# Patient Record
Sex: Female | Born: 1940 | Race: White | Hispanic: No | Marital: Married | Smoking: Never smoker
Health system: Southern US, Community
[De-identification: ages and names within clinical notes are randomized; demographics above are authoritative.]

## PROBLEM LIST (undated history)

## (undated) DIAGNOSIS — J45909 Unspecified asthma, uncomplicated: Secondary | ICD-10-CM

## (undated) HISTORY — PX: HIP ARTHROPLASTY: SHX981

---

## 1999-02-03 ENCOUNTER — Other Ambulatory Visit: Admission: RE | Admit: 1999-02-03 | Discharge: 1999-02-03 | Payer: Self-pay | Admitting: *Deleted

## 2007-01-18 ENCOUNTER — Encounter: Admission: RE | Admit: 2007-01-18 | Discharge: 2007-01-18 | Payer: Self-pay | Admitting: Orthopedic Surgery

## 2011-11-02 DIAGNOSIS — B0239 Other herpes zoster eye disease: Secondary | ICD-10-CM | POA: Diagnosis not present

## 2011-11-04 DIAGNOSIS — F4322 Adjustment disorder with anxiety: Secondary | ICD-10-CM | POA: Diagnosis not present

## 2011-11-04 DIAGNOSIS — B028 Zoster with other complications: Secondary | ICD-10-CM | POA: Diagnosis not present

## 2011-11-09 DIAGNOSIS — B0239 Other herpes zoster eye disease: Secondary | ICD-10-CM | POA: Diagnosis not present

## 2011-11-11 DIAGNOSIS — F4322 Adjustment disorder with anxiety: Secondary | ICD-10-CM | POA: Diagnosis not present

## 2011-11-11 DIAGNOSIS — B0222 Postherpetic trigeminal neuralgia: Secondary | ICD-10-CM | POA: Diagnosis not present

## 2011-11-17 DIAGNOSIS — B0222 Postherpetic trigeminal neuralgia: Secondary | ICD-10-CM | POA: Diagnosis not present

## 2011-11-18 DIAGNOSIS — B0239 Other herpes zoster eye disease: Secondary | ICD-10-CM | POA: Diagnosis not present

## 2011-11-23 DIAGNOSIS — Z86010 Personal history of colonic polyps: Secondary | ICD-10-CM | POA: Diagnosis not present

## 2011-11-24 DIAGNOSIS — B0222 Postherpetic trigeminal neuralgia: Secondary | ICD-10-CM | POA: Diagnosis not present

## 2011-12-01 DIAGNOSIS — B0222 Postherpetic trigeminal neuralgia: Secondary | ICD-10-CM | POA: Diagnosis not present

## 2011-12-15 DIAGNOSIS — IMO0001 Reserved for inherently not codable concepts without codable children: Secondary | ICD-10-CM | POA: Diagnosis not present

## 2011-12-15 DIAGNOSIS — B0222 Postherpetic trigeminal neuralgia: Secondary | ICD-10-CM | POA: Diagnosis not present

## 2011-12-15 DIAGNOSIS — R5383 Other fatigue: Secondary | ICD-10-CM | POA: Diagnosis not present

## 2011-12-19 DIAGNOSIS — B0239 Other herpes zoster eye disease: Secondary | ICD-10-CM | POA: Diagnosis not present

## 2011-12-19 DIAGNOSIS — H40019 Open angle with borderline findings, low risk, unspecified eye: Secondary | ICD-10-CM | POA: Diagnosis not present

## 2011-12-28 DIAGNOSIS — IMO0001 Reserved for inherently not codable concepts without codable children: Secondary | ICD-10-CM | POA: Diagnosis not present

## 2011-12-28 DIAGNOSIS — B0222 Postherpetic trigeminal neuralgia: Secondary | ICD-10-CM | POA: Diagnosis not present

## 2011-12-28 DIAGNOSIS — F341 Dysthymic disorder: Secondary | ICD-10-CM | POA: Diagnosis not present

## 2011-12-28 DIAGNOSIS — K432 Incisional hernia without obstruction or gangrene: Secondary | ICD-10-CM | POA: Diagnosis not present

## 2011-12-28 DIAGNOSIS — M1710 Unilateral primary osteoarthritis, unspecified knee: Secondary | ICD-10-CM | POA: Diagnosis not present

## 2012-01-11 DIAGNOSIS — B0222 Postherpetic trigeminal neuralgia: Secondary | ICD-10-CM | POA: Diagnosis not present

## 2012-01-11 DIAGNOSIS — M159 Polyosteoarthritis, unspecified: Secondary | ICD-10-CM | POA: Diagnosis not present

## 2012-01-11 DIAGNOSIS — F341 Dysthymic disorder: Secondary | ICD-10-CM | POA: Diagnosis not present

## 2012-02-09 DIAGNOSIS — R609 Edema, unspecified: Secondary | ICD-10-CM | POA: Diagnosis not present

## 2012-02-09 DIAGNOSIS — F341 Dysthymic disorder: Secondary | ICD-10-CM | POA: Diagnosis not present

## 2012-02-09 DIAGNOSIS — M159 Polyosteoarthritis, unspecified: Secondary | ICD-10-CM | POA: Diagnosis not present

## 2012-02-09 DIAGNOSIS — B0222 Postherpetic trigeminal neuralgia: Secondary | ICD-10-CM | POA: Diagnosis not present

## 2012-03-19 DIAGNOSIS — B0222 Postherpetic trigeminal neuralgia: Secondary | ICD-10-CM | POA: Diagnosis not present

## 2012-04-20 DIAGNOSIS — IMO0001 Reserved for inherently not codable concepts without codable children: Secondary | ICD-10-CM | POA: Diagnosis not present

## 2012-04-20 DIAGNOSIS — M79609 Pain in unspecified limb: Secondary | ICD-10-CM | POA: Diagnosis not present

## 2012-04-20 DIAGNOSIS — B0222 Postherpetic trigeminal neuralgia: Secondary | ICD-10-CM | POA: Diagnosis not present

## 2012-05-02 DIAGNOSIS — H40019 Open angle with borderline findings, low risk, unspecified eye: Secondary | ICD-10-CM | POA: Diagnosis not present

## 2012-05-08 DIAGNOSIS — M25559 Pain in unspecified hip: Secondary | ICD-10-CM | POA: Diagnosis not present

## 2012-05-08 DIAGNOSIS — M1610 Unilateral primary osteoarthritis, unspecified hip: Secondary | ICD-10-CM | POA: Diagnosis not present

## 2012-05-22 DIAGNOSIS — M19079 Primary osteoarthritis, unspecified ankle and foot: Secondary | ICD-10-CM | POA: Diagnosis not present

## 2012-06-04 DIAGNOSIS — B0222 Postherpetic trigeminal neuralgia: Secondary | ICD-10-CM | POA: Diagnosis not present

## 2012-06-04 DIAGNOSIS — M79609 Pain in unspecified limb: Secondary | ICD-10-CM | POA: Diagnosis not present

## 2012-06-04 DIAGNOSIS — IMO0001 Reserved for inherently not codable concepts without codable children: Secondary | ICD-10-CM | POA: Diagnosis not present

## 2012-06-04 DIAGNOSIS — F411 Generalized anxiety disorder: Secondary | ICD-10-CM | POA: Diagnosis not present

## 2012-06-15 DIAGNOSIS — Z1231 Encounter for screening mammogram for malignant neoplasm of breast: Secondary | ICD-10-CM | POA: Diagnosis not present

## 2012-06-21 DIAGNOSIS — M76829 Posterior tibial tendinitis, unspecified leg: Secondary | ICD-10-CM | POA: Diagnosis not present

## 2012-06-21 DIAGNOSIS — M7730 Calcaneal spur, unspecified foot: Secondary | ICD-10-CM | POA: Diagnosis not present

## 2012-06-21 DIAGNOSIS — M2140 Flat foot [pes planus] (acquired), unspecified foot: Secondary | ICD-10-CM | POA: Diagnosis not present

## 2012-07-13 DIAGNOSIS — M2140 Flat foot [pes planus] (acquired), unspecified foot: Secondary | ICD-10-CM | POA: Diagnosis not present

## 2012-07-13 DIAGNOSIS — M7730 Calcaneal spur, unspecified foot: Secondary | ICD-10-CM | POA: Diagnosis not present

## 2012-07-13 DIAGNOSIS — M76829 Posterior tibial tendinitis, unspecified leg: Secondary | ICD-10-CM | POA: Diagnosis not present

## 2012-07-17 DIAGNOSIS — B0222 Postherpetic trigeminal neuralgia: Secondary | ICD-10-CM | POA: Diagnosis not present

## 2012-07-17 DIAGNOSIS — F411 Generalized anxiety disorder: Secondary | ICD-10-CM | POA: Diagnosis not present

## 2012-07-17 DIAGNOSIS — M79609 Pain in unspecified limb: Secondary | ICD-10-CM | POA: Diagnosis not present

## 2012-07-17 DIAGNOSIS — Z23 Encounter for immunization: Secondary | ICD-10-CM | POA: Diagnosis not present

## 2012-09-21 DIAGNOSIS — B0239 Other herpes zoster eye disease: Secondary | ICD-10-CM | POA: Diagnosis not present

## 2012-11-12 DIAGNOSIS — H40019 Open angle with borderline findings, low risk, unspecified eye: Secondary | ICD-10-CM | POA: Diagnosis not present

## 2012-12-19 DIAGNOSIS — M47817 Spondylosis without myelopathy or radiculopathy, lumbosacral region: Secondary | ICD-10-CM | POA: Diagnosis not present

## 2012-12-19 DIAGNOSIS — Z96649 Presence of unspecified artificial hip joint: Secondary | ICD-10-CM | POA: Diagnosis not present

## 2012-12-19 DIAGNOSIS — Z09 Encounter for follow-up examination after completed treatment for conditions other than malignant neoplasm: Secondary | ICD-10-CM | POA: Diagnosis not present

## 2012-12-19 DIAGNOSIS — M25559 Pain in unspecified hip: Secondary | ICD-10-CM | POA: Diagnosis not present

## 2013-01-11 DIAGNOSIS — R079 Chest pain, unspecified: Secondary | ICD-10-CM | POA: Diagnosis not present

## 2013-01-11 DIAGNOSIS — R0602 Shortness of breath: Secondary | ICD-10-CM | POA: Diagnosis not present

## 2013-01-11 DIAGNOSIS — R0609 Other forms of dyspnea: Secondary | ICD-10-CM | POA: Diagnosis not present

## 2013-01-11 DIAGNOSIS — F411 Generalized anxiety disorder: Secondary | ICD-10-CM | POA: Diagnosis not present

## 2013-01-11 DIAGNOSIS — R0989 Other specified symptoms and signs involving the circulatory and respiratory systems: Secondary | ICD-10-CM | POA: Diagnosis not present

## 2013-01-29 DIAGNOSIS — R079 Chest pain, unspecified: Secondary | ICD-10-CM | POA: Diagnosis not present

## 2013-01-29 DIAGNOSIS — F411 Generalized anxiety disorder: Secondary | ICD-10-CM | POA: Diagnosis not present

## 2013-01-29 DIAGNOSIS — M779 Enthesopathy, unspecified: Secondary | ICD-10-CM | POA: Diagnosis not present

## 2013-02-25 DIAGNOSIS — M6789 Other specified disorders of synovium and tendon, multiple sites: Secondary | ICD-10-CM | POA: Diagnosis not present

## 2013-02-25 DIAGNOSIS — M6240 Contracture of muscle, unspecified site: Secondary | ICD-10-CM | POA: Diagnosis not present

## 2013-03-18 DIAGNOSIS — M6789 Other specified disorders of synovium and tendon, multiple sites: Secondary | ICD-10-CM | POA: Diagnosis not present

## 2013-03-20 DIAGNOSIS — M6789 Other specified disorders of synovium and tendon, multiple sites: Secondary | ICD-10-CM | POA: Diagnosis not present

## 2013-03-22 DIAGNOSIS — M6789 Other specified disorders of synovium and tendon, multiple sites: Secondary | ICD-10-CM | POA: Diagnosis not present

## 2013-03-26 DIAGNOSIS — M6789 Other specified disorders of synovium and tendon, multiple sites: Secondary | ICD-10-CM | POA: Diagnosis not present

## 2013-03-28 DIAGNOSIS — M6789 Other specified disorders of synovium and tendon, multiple sites: Secondary | ICD-10-CM | POA: Diagnosis not present

## 2013-04-01 DIAGNOSIS — M6789 Other specified disorders of synovium and tendon, multiple sites: Secondary | ICD-10-CM | POA: Diagnosis not present

## 2013-05-15 DIAGNOSIS — H40019 Open angle with borderline findings, low risk, unspecified eye: Secondary | ICD-10-CM | POA: Diagnosis not present

## 2013-06-27 DIAGNOSIS — Z1231 Encounter for screening mammogram for malignant neoplasm of breast: Secondary | ICD-10-CM | POA: Diagnosis not present

## 2013-10-02 DIAGNOSIS — R079 Chest pain, unspecified: Secondary | ICD-10-CM | POA: Diagnosis not present

## 2013-10-02 DIAGNOSIS — R109 Unspecified abdominal pain: Secondary | ICD-10-CM | POA: Diagnosis not present

## 2013-10-02 DIAGNOSIS — R5381 Other malaise: Secondary | ICD-10-CM | POA: Diagnosis not present

## 2013-10-02 DIAGNOSIS — N63 Unspecified lump in unspecified breast: Secondary | ICD-10-CM | POA: Diagnosis not present

## 2013-10-02 DIAGNOSIS — R072 Precordial pain: Secondary | ICD-10-CM | POA: Diagnosis not present

## 2013-10-02 DIAGNOSIS — R1032 Left lower quadrant pain: Secondary | ICD-10-CM | POA: Diagnosis not present

## 2013-10-08 DIAGNOSIS — D492 Neoplasm of unspecified behavior of bone, soft tissue, and skin: Secondary | ICD-10-CM | POA: Diagnosis not present

## 2013-10-08 DIAGNOSIS — K439 Ventral hernia without obstruction or gangrene: Secondary | ICD-10-CM | POA: Diagnosis not present

## 2013-10-08 DIAGNOSIS — E279 Disorder of adrenal gland, unspecified: Secondary | ICD-10-CM | POA: Diagnosis not present

## 2013-10-08 DIAGNOSIS — K432 Incisional hernia without obstruction or gangrene: Secondary | ICD-10-CM | POA: Diagnosis not present

## 2013-10-08 DIAGNOSIS — R198 Other specified symptoms and signs involving the digestive system and abdomen: Secondary | ICD-10-CM | POA: Diagnosis not present

## 2013-10-08 DIAGNOSIS — R109 Unspecified abdominal pain: Secondary | ICD-10-CM | POA: Diagnosis not present

## 2013-10-08 DIAGNOSIS — E669 Obesity, unspecified: Secondary | ICD-10-CM | POA: Diagnosis not present

## 2013-10-15 DIAGNOSIS — D492 Neoplasm of unspecified behavior of bone, soft tissue, and skin: Secondary | ICD-10-CM | POA: Diagnosis not present

## 2013-10-15 DIAGNOSIS — L821 Other seborrheic keratosis: Secondary | ICD-10-CM | POA: Diagnosis not present

## 2013-11-05 DIAGNOSIS — D492 Neoplasm of unspecified behavior of bone, soft tissue, and skin: Secondary | ICD-10-CM | POA: Diagnosis not present

## 2013-11-05 DIAGNOSIS — K432 Incisional hernia without obstruction or gangrene: Secondary | ICD-10-CM | POA: Diagnosis not present

## 2013-11-05 DIAGNOSIS — E669 Obesity, unspecified: Secondary | ICD-10-CM | POA: Diagnosis not present

## 2013-12-02 DIAGNOSIS — H40019 Open angle with borderline findings, low risk, unspecified eye: Secondary | ICD-10-CM | POA: Diagnosis not present

## 2014-05-15 DIAGNOSIS — D485 Neoplasm of uncertain behavior of skin: Secondary | ICD-10-CM | POA: Diagnosis not present

## 2014-05-29 DIAGNOSIS — D1739 Benign lipomatous neoplasm of skin and subcutaneous tissue of other sites: Secondary | ICD-10-CM | POA: Diagnosis not present

## 2014-06-09 DIAGNOSIS — Z1231 Encounter for screening mammogram for malignant neoplasm of breast: Secondary | ICD-10-CM | POA: Diagnosis not present

## 2014-07-08 DIAGNOSIS — H40019 Open angle with borderline findings, low risk, unspecified eye: Secondary | ICD-10-CM | POA: Diagnosis not present

## 2014-07-17 DIAGNOSIS — R5383 Other fatigue: Secondary | ICD-10-CM | POA: Diagnosis not present

## 2014-07-17 DIAGNOSIS — Z79899 Other long term (current) drug therapy: Secondary | ICD-10-CM | POA: Diagnosis not present

## 2014-07-17 DIAGNOSIS — F411 Generalized anxiety disorder: Secondary | ICD-10-CM | POA: Diagnosis not present

## 2014-07-17 DIAGNOSIS — F341 Dysthymic disorder: Secondary | ICD-10-CM | POA: Diagnosis not present

## 2014-07-17 DIAGNOSIS — K2900 Acute gastritis without bleeding: Secondary | ICD-10-CM | POA: Diagnosis not present

## 2014-07-17 DIAGNOSIS — M25559 Pain in unspecified hip: Secondary | ICD-10-CM | POA: Diagnosis not present

## 2014-07-17 DIAGNOSIS — R1084 Generalized abdominal pain: Secondary | ICD-10-CM | POA: Diagnosis not present

## 2014-07-17 DIAGNOSIS — R5381 Other malaise: Secondary | ICD-10-CM | POA: Diagnosis not present

## 2014-07-23 ENCOUNTER — Telehealth (INDEPENDENT_AMBULATORY_CARE_PROVIDER_SITE_OTHER): Payer: Self-pay

## 2014-07-23 NOTE — Telephone Encounter (Signed)
Called and left 2 messages for patient to return a call to our office.  Will await call from patient

## 2014-08-26 DIAGNOSIS — Z96641 Presence of right artificial hip joint: Secondary | ICD-10-CM | POA: Diagnosis not present

## 2014-08-26 DIAGNOSIS — Z471 Aftercare following joint replacement surgery: Secondary | ICD-10-CM | POA: Diagnosis not present

## 2014-08-28 ENCOUNTER — Other Ambulatory Visit (INDEPENDENT_AMBULATORY_CARE_PROVIDER_SITE_OTHER): Payer: Self-pay

## 2014-08-28 DIAGNOSIS — G8929 Other chronic pain: Secondary | ICD-10-CM

## 2014-08-28 DIAGNOSIS — R1031 Right lower quadrant pain: Principal | ICD-10-CM

## 2014-08-28 DIAGNOSIS — K432 Incisional hernia without obstruction or gangrene: Secondary | ICD-10-CM | POA: Diagnosis not present

## 2014-09-02 ENCOUNTER — Inpatient Hospital Stay: Admission: RE | Admit: 2014-09-02 | Payer: Self-pay | Source: Ambulatory Visit

## 2014-09-08 DIAGNOSIS — R1084 Generalized abdominal pain: Secondary | ICD-10-CM | POA: Diagnosis not present

## 2014-09-08 DIAGNOSIS — M25551 Pain in right hip: Secondary | ICD-10-CM | POA: Diagnosis not present

## 2014-09-08 DIAGNOSIS — R0789 Other chest pain: Secondary | ICD-10-CM | POA: Diagnosis not present

## 2014-09-08 DIAGNOSIS — Z23 Encounter for immunization: Secondary | ICD-10-CM | POA: Diagnosis not present

## 2014-09-08 DIAGNOSIS — F411 Generalized anxiety disorder: Secondary | ICD-10-CM | POA: Diagnosis not present

## 2014-09-09 DIAGNOSIS — M7071 Other bursitis of hip, right hip: Secondary | ICD-10-CM | POA: Diagnosis not present

## 2014-09-09 DIAGNOSIS — M25551 Pain in right hip: Secondary | ICD-10-CM | POA: Diagnosis not present

## 2014-09-15 DIAGNOSIS — R002 Palpitations: Secondary | ICD-10-CM | POA: Diagnosis not present

## 2014-09-15 DIAGNOSIS — R9431 Abnormal electrocardiogram [ECG] [EKG]: Secondary | ICD-10-CM | POA: Diagnosis not present

## 2014-10-01 DIAGNOSIS — R9431 Abnormal electrocardiogram [ECG] [EKG]: Secondary | ICD-10-CM | POA: Diagnosis not present

## 2014-11-07 DIAGNOSIS — R5383 Other fatigue: Secondary | ICD-10-CM | POA: Diagnosis not present

## 2015-11-05 DIAGNOSIS — K5732 Diverticulitis of large intestine without perforation or abscess without bleeding: Secondary | ICD-10-CM | POA: Diagnosis not present

## 2015-11-18 DIAGNOSIS — H538 Other visual disturbances: Secondary | ICD-10-CM | POA: Diagnosis not present

## 2015-11-18 DIAGNOSIS — H532 Diplopia: Secondary | ICD-10-CM | POA: Diagnosis not present

## 2015-11-19 DIAGNOSIS — R938 Abnormal findings on diagnostic imaging of other specified body structures: Secondary | ICD-10-CM | POA: Diagnosis not present

## 2015-11-19 DIAGNOSIS — K5732 Diverticulitis of large intestine without perforation or abscess without bleeding: Secondary | ICD-10-CM | POA: Diagnosis not present

## 2015-11-19 DIAGNOSIS — H532 Diplopia: Secondary | ICD-10-CM | POA: Diagnosis not present

## 2015-11-23 DIAGNOSIS — H40013 Open angle with borderline findings, low risk, bilateral: Secondary | ICD-10-CM | POA: Diagnosis not present

## 2015-11-27 DIAGNOSIS — R10814 Left lower quadrant abdominal tenderness: Secondary | ICD-10-CM | POA: Diagnosis not present

## 2015-11-27 DIAGNOSIS — I700 Atherosclerosis of aorta: Secondary | ICD-10-CM | POA: Diagnosis not present

## 2015-11-27 DIAGNOSIS — K429 Umbilical hernia without obstruction or gangrene: Secondary | ICD-10-CM | POA: Diagnosis not present

## 2015-11-27 DIAGNOSIS — K5730 Diverticulosis of large intestine without perforation or abscess without bleeding: Secondary | ICD-10-CM | POA: Diagnosis not present

## 2015-11-27 DIAGNOSIS — D3502 Benign neoplasm of left adrenal gland: Secondary | ICD-10-CM | POA: Diagnosis not present

## 2015-12-07 DIAGNOSIS — K5732 Diverticulitis of large intestine without perforation or abscess without bleeding: Secondary | ICD-10-CM | POA: Diagnosis not present

## 2015-12-07 DIAGNOSIS — K469 Unspecified abdominal hernia without obstruction or gangrene: Secondary | ICD-10-CM | POA: Diagnosis not present

## 2015-12-07 DIAGNOSIS — H532 Diplopia: Secondary | ICD-10-CM | POA: Diagnosis not present

## 2016-01-20 DIAGNOSIS — H01004 Unspecified blepharitis left upper eyelid: Secondary | ICD-10-CM | POA: Diagnosis not present

## 2016-02-11 DIAGNOSIS — K439 Ventral hernia without obstruction or gangrene: Secondary | ICD-10-CM | POA: Diagnosis not present

## 2016-02-11 DIAGNOSIS — K5792 Diverticulitis of intestine, part unspecified, without perforation or abscess without bleeding: Secondary | ICD-10-CM | POA: Diagnosis not present

## 2016-02-12 DIAGNOSIS — J309 Allergic rhinitis, unspecified: Secondary | ICD-10-CM | POA: Diagnosis not present

## 2016-02-17 DIAGNOSIS — H00015 Hordeolum externum left lower eyelid: Secondary | ICD-10-CM | POA: Diagnosis not present

## 2016-02-29 DIAGNOSIS — H01001 Unspecified blepharitis right upper eyelid: Secondary | ICD-10-CM | POA: Diagnosis not present

## 2016-02-29 DIAGNOSIS — H00015 Hordeolum externum left lower eyelid: Secondary | ICD-10-CM | POA: Diagnosis not present

## 2016-03-08 DIAGNOSIS — K589 Irritable bowel syndrome without diarrhea: Secondary | ICD-10-CM | POA: Diagnosis not present

## 2016-03-08 DIAGNOSIS — N644 Mastodynia: Secondary | ICD-10-CM | POA: Diagnosis not present

## 2016-03-08 DIAGNOSIS — K429 Umbilical hernia without obstruction or gangrene: Secondary | ICD-10-CM | POA: Diagnosis not present

## 2016-03-14 DIAGNOSIS — K5732 Diverticulitis of large intestine without perforation or abscess without bleeding: Secondary | ICD-10-CM | POA: Diagnosis not present

## 2016-03-14 DIAGNOSIS — R935 Abnormal findings on diagnostic imaging of other abdominal regions, including retroperitoneum: Secondary | ICD-10-CM | POA: Diagnosis not present

## 2016-03-23 DIAGNOSIS — N3001 Acute cystitis with hematuria: Secondary | ICD-10-CM | POA: Diagnosis not present

## 2016-03-23 DIAGNOSIS — N3000 Acute cystitis without hematuria: Secondary | ICD-10-CM | POA: Diagnosis not present

## 2016-04-07 DIAGNOSIS — N644 Mastodynia: Secondary | ICD-10-CM | POA: Diagnosis not present

## 2016-04-11 DIAGNOSIS — N3001 Acute cystitis with hematuria: Secondary | ICD-10-CM | POA: Diagnosis not present

## 2016-04-11 DIAGNOSIS — N3000 Acute cystitis without hematuria: Secondary | ICD-10-CM | POA: Diagnosis not present

## 2016-04-13 DIAGNOSIS — M8589 Other specified disorders of bone density and structure, multiple sites: Secondary | ICD-10-CM | POA: Diagnosis not present

## 2016-04-13 DIAGNOSIS — Z780 Asymptomatic menopausal state: Secondary | ICD-10-CM | POA: Diagnosis not present

## 2016-05-02 DIAGNOSIS — Z800 Family history of malignant neoplasm of digestive organs: Secondary | ICD-10-CM | POA: Diagnosis not present

## 2016-05-02 DIAGNOSIS — K5732 Diverticulitis of large intestine without perforation or abscess without bleeding: Secondary | ICD-10-CM | POA: Diagnosis not present

## 2016-05-04 DIAGNOSIS — N3001 Acute cystitis with hematuria: Secondary | ICD-10-CM | POA: Diagnosis not present

## 2016-05-23 DIAGNOSIS — H40013 Open angle with borderline findings, low risk, bilateral: Secondary | ICD-10-CM | POA: Diagnosis not present

## 2016-05-31 DIAGNOSIS — L219 Seborrheic dermatitis, unspecified: Secondary | ICD-10-CM | POA: Diagnosis not present

## 2016-05-31 DIAGNOSIS — L299 Pruritus, unspecified: Secondary | ICD-10-CM | POA: Diagnosis not present

## 2016-06-10 DIAGNOSIS — Z96641 Presence of right artificial hip joint: Secondary | ICD-10-CM | POA: Diagnosis not present

## 2016-06-15 DIAGNOSIS — L219 Seborrheic dermatitis, unspecified: Secondary | ICD-10-CM | POA: Diagnosis not present

## 2016-06-15 DIAGNOSIS — L299 Pruritus, unspecified: Secondary | ICD-10-CM | POA: Diagnosis not present

## 2016-07-30 DIAGNOSIS — M25561 Pain in right knee: Secondary | ICD-10-CM | POA: Diagnosis not present

## 2016-08-02 DIAGNOSIS — M25561 Pain in right knee: Secondary | ICD-10-CM | POA: Diagnosis not present

## 2016-08-23 DIAGNOSIS — M23300 Other meniscus derangements, unspecified lateral meniscus, right knee: Secondary | ICD-10-CM | POA: Diagnosis not present

## 2016-09-01 DIAGNOSIS — M7122 Synovial cyst of popliteal space [Baker], left knee: Secondary | ICD-10-CM | POA: Diagnosis not present

## 2016-09-01 DIAGNOSIS — M23300 Other meniscus derangements, unspecified lateral meniscus, right knee: Secondary | ICD-10-CM | POA: Diagnosis not present

## 2016-09-01 DIAGNOSIS — S83241A Other tear of medial meniscus, current injury, right knee, initial encounter: Secondary | ICD-10-CM | POA: Diagnosis not present

## 2016-09-01 DIAGNOSIS — M25461 Effusion, right knee: Secondary | ICD-10-CM | POA: Diagnosis not present

## 2016-09-08 DIAGNOSIS — Z23 Encounter for immunization: Secondary | ICD-10-CM | POA: Diagnosis not present

## 2016-09-08 DIAGNOSIS — R1084 Generalized abdominal pain: Secondary | ICD-10-CM | POA: Diagnosis not present

## 2016-09-09 DIAGNOSIS — R1031 Right lower quadrant pain: Secondary | ICD-10-CM | POA: Diagnosis not present

## 2016-09-09 DIAGNOSIS — K769 Liver disease, unspecified: Secondary | ICD-10-CM | POA: Diagnosis not present

## 2016-09-09 DIAGNOSIS — R1084 Generalized abdominal pain: Secondary | ICD-10-CM | POA: Diagnosis not present

## 2016-09-21 DIAGNOSIS — D3502 Benign neoplasm of left adrenal gland: Secondary | ICD-10-CM | POA: Diagnosis not present

## 2016-09-21 DIAGNOSIS — K7689 Other specified diseases of liver: Secondary | ICD-10-CM | POA: Diagnosis not present

## 2016-09-21 DIAGNOSIS — K439 Ventral hernia without obstruction or gangrene: Secondary | ICD-10-CM | POA: Diagnosis not present

## 2016-10-04 DIAGNOSIS — H04123 Dry eye syndrome of bilateral lacrimal glands: Secondary | ICD-10-CM | POA: Diagnosis not present

## 2016-10-04 DIAGNOSIS — H40003 Preglaucoma, unspecified, bilateral: Secondary | ICD-10-CM | POA: Diagnosis not present

## 2016-10-20 DIAGNOSIS — L0390 Cellulitis, unspecified: Secondary | ICD-10-CM | POA: Diagnosis not present

## 2016-11-09 DIAGNOSIS — L570 Actinic keratosis: Secondary | ICD-10-CM | POA: Diagnosis not present

## 2016-12-15 DIAGNOSIS — R1084 Generalized abdominal pain: Secondary | ICD-10-CM | POA: Diagnosis not present

## 2016-12-15 DIAGNOSIS — R5383 Other fatigue: Secondary | ICD-10-CM | POA: Diagnosis not present

## 2016-12-15 DIAGNOSIS — R194 Change in bowel habit: Secondary | ICD-10-CM | POA: Diagnosis not present

## 2016-12-16 DIAGNOSIS — R1084 Generalized abdominal pain: Secondary | ICD-10-CM | POA: Diagnosis not present

## 2016-12-19 DIAGNOSIS — K591 Functional diarrhea: Secondary | ICD-10-CM | POA: Diagnosis not present

## 2016-12-19 DIAGNOSIS — K5732 Diverticulitis of large intestine without perforation or abscess without bleeding: Secondary | ICD-10-CM | POA: Diagnosis not present

## 2016-12-19 DIAGNOSIS — K5730 Diverticulosis of large intestine without perforation or abscess without bleeding: Secondary | ICD-10-CM | POA: Diagnosis not present

## 2017-01-16 DIAGNOSIS — K5732 Diverticulitis of large intestine without perforation or abscess without bleeding: Secondary | ICD-10-CM | POA: Diagnosis not present

## 2017-01-16 DIAGNOSIS — K5730 Diverticulosis of large intestine without perforation or abscess without bleeding: Secondary | ICD-10-CM | POA: Diagnosis not present

## 2017-01-16 DIAGNOSIS — K591 Functional diarrhea: Secondary | ICD-10-CM | POA: Diagnosis not present

## 2017-01-25 DIAGNOSIS — D2239 Melanocytic nevi of other parts of face: Secondary | ICD-10-CM | POA: Diagnosis not present

## 2017-01-25 DIAGNOSIS — L820 Inflamed seborrheic keratosis: Secondary | ICD-10-CM | POA: Diagnosis not present

## 2017-01-25 DIAGNOSIS — C44622 Squamous cell carcinoma of skin of right upper limb, including shoulder: Secondary | ICD-10-CM | POA: Diagnosis not present

## 2017-02-07 DIAGNOSIS — C44622 Squamous cell carcinoma of skin of right upper limb, including shoulder: Secondary | ICD-10-CM | POA: Diagnosis not present

## 2017-03-13 DIAGNOSIS — L853 Xerosis cutis: Secondary | ICD-10-CM | POA: Diagnosis not present

## 2017-03-13 DIAGNOSIS — L300 Nummular dermatitis: Secondary | ICD-10-CM | POA: Diagnosis not present

## 2017-04-24 DIAGNOSIS — I83813 Varicose veins of bilateral lower extremities with pain: Secondary | ICD-10-CM | POA: Diagnosis not present

## 2017-04-28 DIAGNOSIS — I83813 Varicose veins of bilateral lower extremities with pain: Secondary | ICD-10-CM | POA: Diagnosis not present

## 2017-05-08 DIAGNOSIS — I83811 Varicose veins of right lower extremities with pain: Secondary | ICD-10-CM | POA: Diagnosis not present

## 2017-05-17 DIAGNOSIS — H25813 Combined forms of age-related cataract, bilateral: Secondary | ICD-10-CM | POA: Diagnosis not present

## 2017-06-01 DIAGNOSIS — C44622 Squamous cell carcinoma of skin of right upper limb, including shoulder: Secondary | ICD-10-CM | POA: Diagnosis not present

## 2017-06-19 DIAGNOSIS — I83813 Varicose veins of bilateral lower extremities with pain: Secondary | ICD-10-CM | POA: Diagnosis not present

## 2017-07-18 DIAGNOSIS — H25811 Combined forms of age-related cataract, right eye: Secondary | ICD-10-CM | POA: Diagnosis not present

## 2017-08-03 DIAGNOSIS — M1612 Unilateral primary osteoarthritis, left hip: Secondary | ICD-10-CM | POA: Diagnosis not present

## 2017-08-09 DIAGNOSIS — H25811 Combined forms of age-related cataract, right eye: Secondary | ICD-10-CM | POA: Diagnosis not present

## 2017-08-09 DIAGNOSIS — H2511 Age-related nuclear cataract, right eye: Secondary | ICD-10-CM | POA: Diagnosis not present

## 2017-09-07 DIAGNOSIS — Z1231 Encounter for screening mammogram for malignant neoplasm of breast: Secondary | ICD-10-CM | POA: Diagnosis not present

## 2018-03-09 DIAGNOSIS — E663 Overweight: Secondary | ICD-10-CM | POA: Diagnosis not present

## 2018-03-09 DIAGNOSIS — S61412A Laceration without foreign body of left hand, initial encounter: Secondary | ICD-10-CM | POA: Diagnosis not present

## 2018-03-09 DIAGNOSIS — Z6826 Body mass index (BMI) 26.0-26.9, adult: Secondary | ICD-10-CM | POA: Diagnosis not present

## 2018-03-09 DIAGNOSIS — R1010 Upper abdominal pain, unspecified: Secondary | ICD-10-CM | POA: Diagnosis not present

## 2018-03-09 DIAGNOSIS — F419 Anxiety disorder, unspecified: Secondary | ICD-10-CM | POA: Diagnosis not present

## 2018-03-09 DIAGNOSIS — Z9181 History of falling: Secondary | ICD-10-CM | POA: Diagnosis not present

## 2018-03-09 DIAGNOSIS — Z1331 Encounter for screening for depression: Secondary | ICD-10-CM | POA: Diagnosis not present

## 2018-04-13 DIAGNOSIS — S50911A Unspecified superficial injury of right forearm, initial encounter: Secondary | ICD-10-CM | POA: Diagnosis not present

## 2018-04-13 DIAGNOSIS — S60911A Unspecified superficial injury of right wrist, initial encounter: Secondary | ICD-10-CM | POA: Diagnosis not present

## 2018-04-13 DIAGNOSIS — K529 Noninfective gastroenteritis and colitis, unspecified: Secondary | ICD-10-CM | POA: Diagnosis not present

## 2018-04-17 DIAGNOSIS — S51801A Unspecified open wound of right forearm, initial encounter: Secondary | ICD-10-CM | POA: Diagnosis not present

## 2018-04-20 DIAGNOSIS — E782 Mixed hyperlipidemia: Secondary | ICD-10-CM | POA: Diagnosis not present

## 2018-04-20 DIAGNOSIS — R252 Cramp and spasm: Secondary | ICD-10-CM | POA: Diagnosis not present

## 2018-04-20 DIAGNOSIS — K2990 Gastroduodenitis, unspecified, without bleeding: Secondary | ICD-10-CM | POA: Diagnosis not present

## 2018-04-20 DIAGNOSIS — Z0000 Encounter for general adult medical examination without abnormal findings: Secondary | ICD-10-CM | POA: Diagnosis not present

## 2018-04-20 DIAGNOSIS — Z6826 Body mass index (BMI) 26.0-26.9, adult: Secondary | ICD-10-CM | POA: Diagnosis not present

## 2018-04-20 DIAGNOSIS — R1010 Upper abdominal pain, unspecified: Secondary | ICD-10-CM | POA: Diagnosis not present

## 2018-04-20 DIAGNOSIS — Z79899 Other long term (current) drug therapy: Secondary | ICD-10-CM | POA: Diagnosis not present

## 2018-04-23 DIAGNOSIS — H04123 Dry eye syndrome of bilateral lacrimal glands: Secondary | ICD-10-CM | POA: Diagnosis not present

## 2018-05-23 DIAGNOSIS — H0014 Chalazion left upper eyelid: Secondary | ICD-10-CM | POA: Diagnosis not present

## 2018-05-31 DIAGNOSIS — M1612 Unilateral primary osteoarthritis, left hip: Secondary | ICD-10-CM | POA: Diagnosis not present

## 2018-05-31 DIAGNOSIS — M47816 Spondylosis without myelopathy or radiculopathy, lumbar region: Secondary | ICD-10-CM | POA: Diagnosis not present

## 2018-05-31 DIAGNOSIS — M79605 Pain in left leg: Secondary | ICD-10-CM | POA: Diagnosis not present

## 2018-05-31 DIAGNOSIS — Z96641 Presence of right artificial hip joint: Secondary | ICD-10-CM | POA: Diagnosis not present

## 2018-05-31 DIAGNOSIS — M4316 Spondylolisthesis, lumbar region: Secondary | ICD-10-CM | POA: Diagnosis not present

## 2018-05-31 DIAGNOSIS — M7062 Trochanteric bursitis, left hip: Secondary | ICD-10-CM | POA: Diagnosis not present

## 2018-07-18 DIAGNOSIS — F419 Anxiety disorder, unspecified: Secondary | ICD-10-CM | POA: Diagnosis not present

## 2018-07-18 DIAGNOSIS — Z6826 Body mass index (BMI) 26.0-26.9, adult: Secondary | ICD-10-CM | POA: Diagnosis not present

## 2018-07-18 DIAGNOSIS — Z1339 Encounter for screening examination for other mental health and behavioral disorders: Secondary | ICD-10-CM | POA: Diagnosis not present

## 2018-07-18 DIAGNOSIS — H8110 Benign paroxysmal vertigo, unspecified ear: Secondary | ICD-10-CM | POA: Diagnosis not present

## 2018-08-28 DIAGNOSIS — M47818 Spondylosis without myelopathy or radiculopathy, sacral and sacrococcygeal region: Secondary | ICD-10-CM | POA: Diagnosis not present

## 2018-08-28 DIAGNOSIS — M1612 Unilateral primary osteoarthritis, left hip: Secondary | ICD-10-CM | POA: Diagnosis not present

## 2018-09-08 DIAGNOSIS — Z1231 Encounter for screening mammogram for malignant neoplasm of breast: Secondary | ICD-10-CM | POA: Diagnosis not present

## 2018-09-11 DIAGNOSIS — M47816 Spondylosis without myelopathy or radiculopathy, lumbar region: Secondary | ICD-10-CM | POA: Diagnosis not present

## 2018-09-11 DIAGNOSIS — M5136 Other intervertebral disc degeneration, lumbar region: Secondary | ICD-10-CM | POA: Diagnosis not present

## 2018-09-11 DIAGNOSIS — M47818 Spondylosis without myelopathy or radiculopathy, sacral and sacrococcygeal region: Secondary | ICD-10-CM | POA: Diagnosis not present

## 2018-09-11 DIAGNOSIS — M542 Cervicalgia: Secondary | ICD-10-CM | POA: Diagnosis not present

## 2018-09-11 DIAGNOSIS — M5412 Radiculopathy, cervical region: Secondary | ICD-10-CM | POA: Diagnosis not present

## 2018-09-17 DIAGNOSIS — M5412 Radiculopathy, cervical region: Secondary | ICD-10-CM | POA: Diagnosis not present

## 2018-09-17 DIAGNOSIS — M5416 Radiculopathy, lumbar region: Secondary | ICD-10-CM | POA: Diagnosis not present

## 2018-09-17 DIAGNOSIS — M47818 Spondylosis without myelopathy or radiculopathy, sacral and sacrococcygeal region: Secondary | ICD-10-CM | POA: Diagnosis not present

## 2018-09-25 DIAGNOSIS — M5416 Radiculopathy, lumbar region: Secondary | ICD-10-CM | POA: Diagnosis not present

## 2018-09-25 DIAGNOSIS — M542 Cervicalgia: Secondary | ICD-10-CM | POA: Diagnosis not present

## 2018-09-25 DIAGNOSIS — M5412 Radiculopathy, cervical region: Secondary | ICD-10-CM | POA: Diagnosis not present

## 2018-09-25 DIAGNOSIS — M545 Low back pain: Secondary | ICD-10-CM | POA: Diagnosis not present

## 2018-10-04 DIAGNOSIS — M5412 Radiculopathy, cervical region: Secondary | ICD-10-CM | POA: Diagnosis not present

## 2018-10-04 DIAGNOSIS — M5416 Radiculopathy, lumbar region: Secondary | ICD-10-CM | POA: Diagnosis not present

## 2018-10-04 DIAGNOSIS — M47818 Spondylosis without myelopathy or radiculopathy, sacral and sacrococcygeal region: Secondary | ICD-10-CM | POA: Diagnosis not present

## 2018-10-11 DIAGNOSIS — M7062 Trochanteric bursitis, left hip: Secondary | ICD-10-CM | POA: Diagnosis not present

## 2018-10-15 DIAGNOSIS — M47818 Spondylosis without myelopathy or radiculopathy, sacral and sacrococcygeal region: Secondary | ICD-10-CM | POA: Diagnosis not present

## 2018-10-15 DIAGNOSIS — M47816 Spondylosis without myelopathy or radiculopathy, lumbar region: Secondary | ICD-10-CM | POA: Diagnosis not present

## 2018-10-15 DIAGNOSIS — M5416 Radiculopathy, lumbar region: Secondary | ICD-10-CM | POA: Diagnosis not present

## 2018-10-15 DIAGNOSIS — M5136 Other intervertebral disc degeneration, lumbar region: Secondary | ICD-10-CM | POA: Diagnosis not present

## 2018-11-05 DIAGNOSIS — K5730 Diverticulosis of large intestine without perforation or abscess without bleeding: Secondary | ICD-10-CM | POA: Diagnosis not present

## 2018-11-08 DIAGNOSIS — R1013 Epigastric pain: Secondary | ICD-10-CM | POA: Diagnosis not present

## 2018-11-13 DIAGNOSIS — M5416 Radiculopathy, lumbar region: Secondary | ICD-10-CM | POA: Diagnosis not present

## 2018-11-13 DIAGNOSIS — M545 Low back pain: Secondary | ICD-10-CM | POA: Diagnosis not present

## 2018-11-14 DIAGNOSIS — M1612 Unilateral primary osteoarthritis, left hip: Secondary | ICD-10-CM | POA: Diagnosis not present

## 2018-11-14 DIAGNOSIS — M5136 Other intervertebral disc degeneration, lumbar region: Secondary | ICD-10-CM | POA: Diagnosis not present

## 2018-11-14 DIAGNOSIS — M47816 Spondylosis without myelopathy or radiculopathy, lumbar region: Secondary | ICD-10-CM | POA: Diagnosis not present

## 2018-11-14 DIAGNOSIS — M7072 Other bursitis of hip, left hip: Secondary | ICD-10-CM | POA: Diagnosis not present

## 2018-11-14 DIAGNOSIS — M47817 Spondylosis without myelopathy or radiculopathy, lumbosacral region: Secondary | ICD-10-CM | POA: Diagnosis not present

## 2018-12-13 DIAGNOSIS — M25552 Pain in left hip: Secondary | ICD-10-CM | POA: Diagnosis not present

## 2018-12-13 DIAGNOSIS — M5136 Other intervertebral disc degeneration, lumbar region: Secondary | ICD-10-CM | POA: Diagnosis not present

## 2018-12-13 DIAGNOSIS — M47816 Spondylosis without myelopathy or radiculopathy, lumbar region: Secondary | ICD-10-CM | POA: Diagnosis not present

## 2018-12-13 DIAGNOSIS — M1612 Unilateral primary osteoarthritis, left hip: Secondary | ICD-10-CM | POA: Diagnosis not present

## 2018-12-13 DIAGNOSIS — Z96641 Presence of right artificial hip joint: Secondary | ICD-10-CM | POA: Diagnosis not present

## 2018-12-17 DIAGNOSIS — Z79899 Other long term (current) drug therapy: Secondary | ICD-10-CM | POA: Diagnosis not present

## 2018-12-17 DIAGNOSIS — M5136 Other intervertebral disc degeneration, lumbar region: Secondary | ICD-10-CM | POA: Diagnosis not present

## 2018-12-17 DIAGNOSIS — Z09 Encounter for follow-up examination after completed treatment for conditions other than malignant neoplasm: Secondary | ICD-10-CM | POA: Diagnosis not present

## 2018-12-17 DIAGNOSIS — M1612 Unilateral primary osteoarthritis, left hip: Secondary | ICD-10-CM | POA: Diagnosis not present

## 2018-12-17 DIAGNOSIS — M47816 Spondylosis without myelopathy or radiculopathy, lumbar region: Secondary | ICD-10-CM | POA: Diagnosis not present

## 2018-12-17 DIAGNOSIS — R1011 Right upper quadrant pain: Secondary | ICD-10-CM | POA: Diagnosis not present

## 2018-12-17 DIAGNOSIS — M47818 Spondylosis without myelopathy or radiculopathy, sacral and sacrococcygeal region: Secondary | ICD-10-CM | POA: Diagnosis not present

## 2019-01-02 DIAGNOSIS — M47818 Spondylosis without myelopathy or radiculopathy, sacral and sacrococcygeal region: Secondary | ICD-10-CM | POA: Diagnosis not present

## 2019-01-02 DIAGNOSIS — M4316 Spondylolisthesis, lumbar region: Secondary | ICD-10-CM | POA: Diagnosis not present

## 2019-01-02 DIAGNOSIS — M47816 Spondylosis without myelopathy or radiculopathy, lumbar region: Secondary | ICD-10-CM | POA: Diagnosis not present

## 2019-01-02 DIAGNOSIS — M5136 Other intervertebral disc degeneration, lumbar region: Secondary | ICD-10-CM | POA: Diagnosis not present

## 2019-01-02 DIAGNOSIS — M1612 Unilateral primary osteoarthritis, left hip: Secondary | ICD-10-CM | POA: Diagnosis not present

## 2019-01-03 DIAGNOSIS — R51 Headache: Secondary | ICD-10-CM | POA: Diagnosis not present

## 2019-01-03 DIAGNOSIS — I10 Essential (primary) hypertension: Secondary | ICD-10-CM | POA: Diagnosis not present

## 2019-01-07 DIAGNOSIS — E663 Overweight: Secondary | ICD-10-CM | POA: Diagnosis not present

## 2019-01-07 DIAGNOSIS — Z6826 Body mass index (BMI) 26.0-26.9, adult: Secondary | ICD-10-CM | POA: Diagnosis not present

## 2019-01-07 DIAGNOSIS — R030 Elevated blood-pressure reading, without diagnosis of hypertension: Secondary | ICD-10-CM | POA: Diagnosis not present

## 2019-01-07 DIAGNOSIS — M1612 Unilateral primary osteoarthritis, left hip: Secondary | ICD-10-CM | POA: Diagnosis not present

## 2019-03-11 DIAGNOSIS — M5136 Other intervertebral disc degeneration, lumbar region: Secondary | ICD-10-CM | POA: Diagnosis not present

## 2019-03-11 DIAGNOSIS — Z96641 Presence of right artificial hip joint: Secondary | ICD-10-CM | POA: Diagnosis not present

## 2019-03-11 DIAGNOSIS — M1612 Unilateral primary osteoarthritis, left hip: Secondary | ICD-10-CM | POA: Diagnosis not present

## 2019-03-11 DIAGNOSIS — M47816 Spondylosis without myelopathy or radiculopathy, lumbar region: Secondary | ICD-10-CM | POA: Diagnosis not present

## 2019-04-03 DIAGNOSIS — M47816 Spondylosis without myelopathy or radiculopathy, lumbar region: Secondary | ICD-10-CM | POA: Diagnosis not present

## 2019-04-03 DIAGNOSIS — M1612 Unilateral primary osteoarthritis, left hip: Secondary | ICD-10-CM | POA: Diagnosis not present

## 2019-04-03 DIAGNOSIS — M5136 Other intervertebral disc degeneration, lumbar region: Secondary | ICD-10-CM | POA: Diagnosis not present

## 2019-04-15 DIAGNOSIS — R112 Nausea with vomiting, unspecified: Secondary | ICD-10-CM | POA: Diagnosis not present

## 2019-04-15 DIAGNOSIS — R1012 Left upper quadrant pain: Secondary | ICD-10-CM | POA: Diagnosis not present

## 2019-04-16 DIAGNOSIS — R160 Hepatomegaly, not elsewhere classified: Secondary | ICD-10-CM | POA: Diagnosis not present

## 2019-04-16 DIAGNOSIS — R1012 Left upper quadrant pain: Secondary | ICD-10-CM | POA: Diagnosis not present

## 2019-04-16 DIAGNOSIS — K7689 Other specified diseases of liver: Secondary | ICD-10-CM | POA: Diagnosis not present

## 2019-04-16 DIAGNOSIS — M1612 Unilateral primary osteoarthritis, left hip: Secondary | ICD-10-CM | POA: Diagnosis not present

## 2019-04-23 DIAGNOSIS — N6314 Unspecified lump in the right breast, lower inner quadrant: Secondary | ICD-10-CM | POA: Diagnosis not present

## 2019-05-14 ENCOUNTER — Other Ambulatory Visit: Payer: Self-pay

## 2019-05-14 ENCOUNTER — Ambulatory Visit (INDEPENDENT_AMBULATORY_CARE_PROVIDER_SITE_OTHER): Payer: PPO | Admitting: Orthopaedic Surgery

## 2019-05-14 ENCOUNTER — Ambulatory Visit (INDEPENDENT_AMBULATORY_CARE_PROVIDER_SITE_OTHER): Payer: PPO

## 2019-05-14 DIAGNOSIS — M25552 Pain in left hip: Secondary | ICD-10-CM | POA: Diagnosis not present

## 2019-05-14 DIAGNOSIS — M1612 Unilateral primary osteoarthritis, left hip: Secondary | ICD-10-CM

## 2019-05-14 NOTE — Progress Notes (Signed)
Office Visit Note   Patient: Catherine Trujillo           Date of Birth: 1941-10-25           MRN: [id number] Visit Date: 05/14/2019              Requested by: No referring provider defined for this encounter. PCP: Patient, No Pcp Per   Assessment & Plan: Visit Diagnoses:  1. Pain in left hip   2. Unilateral primary osteoarthritis, left hip     Plan: Based on her clinical exam findings and x-ray findings we are recommending a total hip arthroplasty for her left hip.  I showed her hip model and went over x-rays and explained in detail what the surgery involves the direct anterior approach.  We talked about the intraoperative and postoperative course.  We had a long and thorough discussion about the risk and benefits of surgery.  Given the fact she is failed conservative treatment for over a year now and given the detrimental effect that her hip pain is had on the mobility, her quality of life and activities daily living we are recommending a total hip arthroplasty.  All question concerns were answered addressed.  She is interested in getting this scheduled.   Follow-Up Instructions: Return for 2 weeks post-op.   Orders:  Orders Placed This Encounter  Procedures  . XR HIP UNILAT W OR W/O PELVIS 1V LEFT   No orders of the defined types were placed in this encounter.     Procedures: No procedures performed   Clinical Data: No additional findings.   Subjective: Chief Complaint  Patient presents with  . Left Hip - Pain  Patient comes in today for evaluation treatment of worsening left hip pain.  She has a history of a right total hip arthroplasty done around 2012 in [facility].  She is experiencing pain all around her left hip and in the groin.  She has back pain 10 and does get epidural steroid injections but it does not help her left hip.  She does walk with a cane.  It is gotten to where her left hip pain is detrimentally affecting her actives daily living, her  quality of life and her mobility.  He can be 10 out of 10.  It is worsened for over a year now.  She is tried and failed all forms of conservative treatment including activity modification, anti-inflammatories, injections, walking with assistive device and hip strengthening exercises.  At this point she wants to consider hip replacement surgery.  Her husband is with her.  She is an active individual is not a diabetic.  HPI  Review of Systems She currently denies any headache, chest pain, shortness of breath, fever, chills, nausea, vomiting  Objective: Vital Signs: There were no vitals taken for this visit.  Physical Exam She is alert and orient x3 and in no acute distress Ortho Exam Examination of her right total hip shows that it moves smoothly and normally with no pain at all.  Examination of her left hip shows severe pain with any attempts of internal and external rotation.  There is significant stiffness as well in her left hip. Specialty Comments:  No specialty comments available.  Imaging: Xr Hip Unilat W Or W/o Pelvis 1v Left  Result Date: 05/14/2019 An AP pelvis and lateral of the left hip shows severe end-stage arthritis of the left hip.  There is flattening of the femoral head.  There is significant  loss of the superior lateral joint space with joint space narrowing.  There are periarticular osteophytes around the hip as well as sclerotic changes in the femoral head.  There is a right total hip arthroplasty is on the AP view appears well-seated with no complicating features.    PMFS History: Patient Active Problem List   Diagnosis Date Noted  . Unilateral primary osteoarthritis, left hip 05/14/2019   No past medical history on file.  No family history on file.   Social History   Occupational History  . Not on file  Tobacco Use  . Smoking status: Not on file  Substance and Sexual Activity  . Alcohol use: Not on file  . Drug use: Not on file  . Sexual activity: Not on  file

## 2019-05-21 DIAGNOSIS — R1033 Periumbilical pain: Secondary | ICD-10-CM | POA: Diagnosis not present

## 2019-05-21 DIAGNOSIS — Z800 Family history of malignant neoplasm of digestive organs: Secondary | ICD-10-CM | POA: Diagnosis not present

## 2019-05-21 DIAGNOSIS — R1013 Epigastric pain: Secondary | ICD-10-CM | POA: Diagnosis not present

## 2019-05-29 ENCOUNTER — Other Ambulatory Visit: Payer: Self-pay | Admitting: Physician Assistant

## 2019-05-29 ENCOUNTER — Other Ambulatory Visit: Payer: Self-pay

## 2019-05-30 ENCOUNTER — Other Ambulatory Visit: Payer: Self-pay

## 2019-06-06 DIAGNOSIS — R1013 Epigastric pain: Secondary | ICD-10-CM | POA: Diagnosis not present

## 2019-06-06 NOTE — Pre-Procedure Instructions (Addendum)
[facility] [place] [place] [address] Phone: [phone] Fax: [phone]      Your procedure is scheduled on 06-11-19  Report to [facility] Main Entrance "A" at 1214 PM., and check in at the Admitting office.  Call this number if you have problems the morning of surgery:  [phone]  Call [phone] if you have any questions prior to your surgery date Monday-Friday 8am-4pm    Remember:  Do not eat or drink after midnight the night before your surgery  You may drink clear liquids until 11AM the morning of your surgery.   Clear liquids allowed are: Water, Non-Citrus Juices (without pulp), Carbonated Beverages, Clear Tea, Black Coffee Only, and Gatorade  Please complete your PRE-SURGERY ENSURE that was provided to you by 11AM ... the morning of surgery.  Please, if able, drink it in one setting. DO NOT SIP.   Take these medicines the morning of surgery with A SIP OF WATER : acetaminophen (TYLENOL) ALPRAZolam (XANAX) as needed Polyethyl Glycol-Propyl Glycol (SYSTANE)as needed  7 days prior to surgery STOP taking any Aspirin (unless otherwise instructed by your surgeon), Aleve, Naproxen, Ibuprofen, Motrin, Advil, Goody's, BC's, all herbal medications, fish oil, and all vitamins.    The Morning of Surgery  Do not wear jewelry, make-up or nail polish.  Do not wear lotions, powders, or perfumes, or deodorant  Do not shave 48 hours prior to surgery.    Do not bring valuables to the hospital.  [facility] is not responsible for any belongings or valuables.  If you are a smoker, DO NOT Smoke 24 hours prior to surgery IF you wear a CPAP at night please bring your mask, tubing, and machine the morning of surgery   Remember that you must have someone to transport you home after your surgery, and remain with you for 24 hours if you are discharged the same  day.  Contacts, glasses, hearing aids, dentures or bridgework may not be worn into surgery.   Leave your suitcase in the car.  After surgery it may be brought to your room.  For patients admitted to the hospital, discharge time will be determined by your treatment team.  Patients discharged the day of surgery will not be allowed to drive home.    Special instructions:   [facility]- Preparing For Surgery  Before surgery, you can play an important role. Because skin is not sterile, your skin needs to be as free of germs as possible. You can reduce the number of germs on your skin by washing with CHG (chlorahexidine gluconate) Soap before surgery.  CHG is an antiseptic cleaner which kills germs and bonds with the skin to continue killing germs even after washing.    Oral Hygiene is also important to reduce your risk of infection.  Remember - BRUSH YOUR TEETH THE MORNING OF SURGERY WITH YOUR REGULAR TOOTHPASTE  Please do not use if you have an allergy to CHG or antibacterial soaps. If your skin becomes reddened/irritated stop using the CHG.  Do not shave (including legs and underarms) for at least 48 hours prior to first CHG shower. It is OK to shave your face.  Please follow these instructions carefully.   1. Shower the NIGHT BEFORE SURGERY and the MORNING OF SURGERY with CHG Soap.   2. If you chose to wash your hair, wash your hair first as usual with  your normal shampoo.  3. After you shampoo, rinse your hair and body thoroughly to remove the shampoo.  4. Use CHG as you would any other liquid soap. You can apply CHG directly to the skin and wash gently with a scrungie or a clean washcloth.   5. Apply the CHG Soap to your body ONLY FROM THE NECK DOWN.  Do not use on open wounds or open sores. Avoid contact with your eyes, ears, mouth and genitals (private parts). Wash Face and genitals (private parts)  with your normal soap.   6. Wash thoroughly, paying special attention to the area  where your surgery will be performed.  7. Thoroughly rinse your body with warm water from the neck down.  8. DO NOT shower/wash with your normal soap after using and rinsing off the CHG Soap.  9. Pat yourself dry with a CLEAN TOWEL.  10. Wear CLEAN PAJAMAS to bed the night before surgery, wear comfortable clothes the morning of surgery  11. Place CLEAN SHEETS on your bed the night of your first shower and DO NOT SLEEP WITH PETS.   Day of Surgery:  Do not apply any deodorants/lotions. Please shower the morning of surgery with the CHG soap  Please wear clean clothes to the hospital/surgery center.   Remember to brush your teeth WITH YOUR REGULAR TOOTHPASTE.   Please read over the  fact sheets that you were given.

## 2019-06-07 ENCOUNTER — Other Ambulatory Visit (HOSPITAL_COMMUNITY)
Admission: RE | Admit: 2019-06-07 | Discharge: 2019-06-07 | Disposition: A | Discharge status: Home / Self Care | Payer: PPO | Source: Ambulatory Visit | Attending: Orthopaedic Surgery | Admitting: Orthopaedic Surgery

## 2019-06-07 ENCOUNTER — Other Ambulatory Visit: Payer: Self-pay

## 2019-06-07 ENCOUNTER — Encounter (HOSPITAL_COMMUNITY): Payer: Self-pay

## 2019-06-07 ENCOUNTER — Encounter (HOSPITAL_COMMUNITY)
Admission: RE | Admit: 2019-06-07 | Discharge: 2019-06-07 | Disposition: A | Discharge status: Home / Self Care | Payer: PPO | Source: Ambulatory Visit | Attending: Orthopaedic Surgery | Admitting: Orthopaedic Surgery

## 2019-06-07 DIAGNOSIS — Z20828 Contact with and (suspected) exposure to other viral communicable diseases: Secondary | ICD-10-CM | POA: Diagnosis not present

## 2019-06-07 DIAGNOSIS — I4510 Unspecified right bundle-branch block: Secondary | ICD-10-CM | POA: Insufficient documentation

## 2019-06-07 DIAGNOSIS — Z01818 Encounter for other preprocedural examination: Secondary | ICD-10-CM | POA: Insufficient documentation

## 2019-06-07 DIAGNOSIS — M1612 Unilateral primary osteoarthritis, left hip: Secondary | ICD-10-CM | POA: Insufficient documentation

## 2019-06-07 HISTORY — DX: Unspecified asthma, uncomplicated: J45.909

## 2019-06-07 LAB — BASIC METABOLIC PANEL
Anion gap: 9 (ref 5–15)
BUN: 8 mg/dL (ref 8–23)
CO2: 25 mmol/L (ref 22–32)
Calcium: 9.3 mg/dL (ref 8.9–10.3)
Chloride: 106 mmol/L (ref 98–111)
Creatinine, Ser: 0.67 mg/dL (ref 0.44–1.00)
GFR calc Af Amer: >60 mL/min (ref >60)
GFR calc non Af Amer: >60 mL/min (ref >60)
Glucose, Bld: 98 mg/dL (ref 70–99)
Potassium: 4.1 mmol/L (ref 3.5–5.1)
Sodium: 140 mmol/L (ref 135–145)

## 2019-06-07 LAB — CBC
HCT: 44.8 % (ref 36.0–46.0)
Hemoglobin: 14.3 g/dL (ref 12.0–15.0)
MCH: 27.4 pg (ref 26.0–34.0)
MCHC: 31.9 g/dL (ref 30.0–36.0)
MCV: 85.8 fL (ref 80.0–100.0)
Platelets: 237 10*3/uL (ref 150–400)
RBC: 5.22 MIL/uL — ABNORMAL HIGH (ref 3.87–5.11)
RDW: 13.2 % (ref 11.5–15.5)
WBC: 9.4 10*3/uL (ref 4.0–10.5)
nRBC: 0 % (ref 0.0–0.2)

## 2019-06-07 LAB — SARS CORONAVIRUS 2 (TAT 6-24 HRS): SARS Coronavirus 2: NEGATIVE

## 2019-06-07 LAB — SURGICAL PCR SCREEN
MRSA, PCR: NEGATIVE
Staphylococcus aureus: POSITIVE — AB

## 2019-06-07 NOTE — Progress Notes (Signed)
  Coronavirus Screening Scheduled for COVID test today . Have you experienced the following symptoms:  Cough yes/no: No Fever (>100.45F)  yes/no: No Runny nose yes/no: No Sore throat yes/no: No Difficulty breathing/shortness of breath  yes/no: No Have you or a family member traveled in the last 14 days and where? yes/no: No  PCP - Dr Christa See, [facility]  GI- Dr Kyra Leyland  Cardiologist - denies  Chest x-ray - NA  EKG - Today  Stress Test - denies  ECHO - denies  Cardiac Cath - denies  AICD-denies PM-denies LOOP-denies  Sleep Study - N CPAP - NA  LABS-CBC,BMP,EKG  ASA-denies   ERAS-Pre-surgery Ensure given with instructions  HA1C-Denies. Not a known diabetic Fasting Blood Sugar -  Checks Blood Sugar __0___ times a day  Anesthesia-Y.  Pt denies having chest pain, sob, or fever at this time. All instructions explained to the pt, with a verbal understanding of the material. Pt agrees to go over the instructions while at home for a better understanding. Pt also instructed to self quarantine after being tested for COVID-19. The opportunity to ask questions was provided.

## 2019-06-10 MED ORDER — TRANEXAMIC ACID-NACL 1000-0.7 MG/100ML-% IV SOLN
1000.0000 mg | INTRAVENOUS | Status: AC
  Administered 2019-06-11: 1000 mg via INTRAVENOUS
  Filled 2019-06-10: qty 100

## 2019-06-11 ENCOUNTER — Ambulatory Visit (HOSPITAL_COMMUNITY): Payer: PPO | Admitting: Anesthesiology

## 2019-06-11 ENCOUNTER — Inpatient Hospital Stay (HOSPITAL_COMMUNITY)
Admission: AD | Admit: 2019-06-11 | Discharge: 2019-06-13 | DRG: 470 | Disposition: A | Discharge status: Home Health | Payer: PPO | Attending: Orthopaedic Surgery | Admitting: Orthopaedic Surgery

## 2019-06-11 ENCOUNTER — Observation Stay (HOSPITAL_COMMUNITY): Payer: PPO

## 2019-06-11 ENCOUNTER — Ambulatory Visit (HOSPITAL_COMMUNITY): Payer: PPO

## 2019-06-11 ENCOUNTER — Ambulatory Visit (HOSPITAL_COMMUNITY): Payer: PPO | Admitting: Physician Assistant

## 2019-06-11 ENCOUNTER — Encounter (HOSPITAL_COMMUNITY)
Admission: AD | Disposition: A | Discharge status: Home Health | Payer: Self-pay | Source: Home / Self Care | Attending: Orthopaedic Surgery

## 2019-06-11 ENCOUNTER — Other Ambulatory Visit: Payer: Self-pay

## 2019-06-11 ENCOUNTER — Encounter (HOSPITAL_COMMUNITY): Payer: Self-pay

## 2019-06-11 DIAGNOSIS — Z9181 History of falling: Secondary | ICD-10-CM

## 2019-06-11 DIAGNOSIS — Z419 Encounter for procedure for purposes other than remedying health state, unspecified: Secondary | ICD-10-CM

## 2019-06-11 DIAGNOSIS — M25752 Osteophyte, left hip: Secondary | ICD-10-CM | POA: Diagnosis present

## 2019-06-11 DIAGNOSIS — M1612 Unilateral primary osteoarthritis, left hip: Principal | ICD-10-CM

## 2019-06-11 DIAGNOSIS — Z96642 Presence of left artificial hip joint: Secondary | ICD-10-CM

## 2019-06-11 DIAGNOSIS — D62 Acute posthemorrhagic anemia: Secondary | ICD-10-CM | POA: Diagnosis not present

## 2019-06-11 DIAGNOSIS — R4189 Other symptoms and signs involving cognitive functions and awareness: Secondary | ICD-10-CM | POA: Diagnosis present

## 2019-06-11 DIAGNOSIS — Z471 Aftercare following joint replacement surgery: Secondary | ICD-10-CM | POA: Diagnosis not present

## 2019-06-11 HISTORY — PX: TOTAL HIP ARTHROPLASTY: SHX124

## 2019-06-11 SURGERY — ARTHROPLASTY, HIP, TOTAL, ANTERIOR APPROACH
Anesthesia: Spinal | Site: Hip | Laterality: Left

## 2019-06-11 MED ORDER — ACETAMINOPHEN 325 MG PO TABS: 325.0000 mg | ORAL_TABLET | Freq: Four times a day (QID) | ORAL | Status: DC | PRN

## 2019-06-11 MED ORDER — METHOCARBAMOL 1000 MG/10ML IJ SOLN
500.0000 mg | Freq: Four times a day (QID) | INTRAVENOUS | Status: DC | PRN
  Filled 2019-06-11: qty 5

## 2019-06-11 MED ORDER — METHOCARBAMOL 500 MG PO TABS: 500.0000 mg | ORAL_TABLET | Freq: Four times a day (QID) | ORAL | Status: DC | PRN

## 2019-06-11 MED ORDER — TRAMADOL HCL 50 MG PO TABS
50.0000 mg | ORAL_TABLET | Freq: Four times a day (QID) | ORAL | Status: DC
  Administered 2019-06-12 – 2019-06-13 (×6): 50 mg via ORAL
  Filled 2019-06-11 (×6): qty 1

## 2019-06-11 MED ORDER — MENTHOL 3 MG MT LOZG: 1.0000 | LOZENGE | OROMUCOSAL | Status: DC | PRN

## 2019-06-11 MED ORDER — DEXMEDETOMIDINE HCL 200 MCG/2ML IV SOLN
INTRAVENOUS | Status: DC | PRN
  Administered 2019-06-11 (×2): 8 ug via INTRAVENOUS
  Administered 2019-06-11: 6 ug via INTRAVENOUS

## 2019-06-11 MED ORDER — DIPHENHYDRAMINE HCL 50 MG/ML IJ SOLN
INTRAMUSCULAR | Status: DC | PRN
  Administered 2019-06-11: 12.5 mg via INTRAVENOUS

## 2019-06-11 MED ORDER — LIDOCAINE 2% (20 MG/ML) 5 ML SYRINGE
INTRAMUSCULAR | Status: AC
  Filled 2019-06-11: qty 10

## 2019-06-11 MED ORDER — HYDROCODONE-ACETAMINOPHEN 5-325 MG PO TABS
1.0000 | ORAL_TABLET | ORAL | Status: DC | PRN
  Administered 2019-06-12: 22:00:00 2 via ORAL
  Administered 2019-06-12: 14:00:00 1 via ORAL
  Filled 2019-06-11 (×2): qty 2

## 2019-06-11 MED ORDER — PROMETHAZINE HCL 25 MG/ML IJ SOLN
INTRAMUSCULAR | Status: AC
  Filled 2019-06-11: qty 1

## 2019-06-11 MED ORDER — POLYVINYL ALCOHOL 1.4 % OP SOLN
1.0000 [drp] | Freq: Every day | OPHTHALMIC | Status: DC | PRN
  Filled 2019-06-11: qty 15

## 2019-06-11 MED ORDER — FENTANYL CITRATE (PF) 100 MCG/2ML IJ SOLN
25.0000 ug | INTRAMUSCULAR | Status: DC | PRN
  Administered 2019-06-11 (×2): 50 ug via INTRAVENOUS

## 2019-06-11 MED ORDER — POLYETHYLENE GLYCOL 3350 17 G PO PACK: 17.0000 g | PACK | Freq: Every day | ORAL | Status: DC | PRN

## 2019-06-11 MED ORDER — SODIUM CHLORIDE 0.9 % IR SOLN
Status: DC | PRN
  Administered 2019-06-11: 3000 mL

## 2019-06-11 MED ORDER — EPHEDRINE SULFATE 50 MG/ML IJ SOLN
INTRAMUSCULAR | Status: DC | PRN
  Administered 2019-06-11: 5 mg via INTRAVENOUS

## 2019-06-11 MED ORDER — DIPHENHYDRAMINE HCL 12.5 MG/5ML PO ELIX: 12.5000 mg | ORAL_SOLUTION | ORAL | Status: DC | PRN

## 2019-06-11 MED ORDER — CHLORHEXIDINE GLUCONATE 4 % EX LIQD: 60.0000 mL | Freq: Once | CUTANEOUS | Status: DC

## 2019-06-11 MED ORDER — METOCLOPRAMIDE HCL 5 MG PO TABS: 5.0000 mg | ORAL_TABLET | Freq: Three times a day (TID) | ORAL | Status: DC | PRN

## 2019-06-11 MED ORDER — LIDOCAINE 2% (20 MG/ML) 5 ML SYRINGE
INTRAMUSCULAR | Status: DC | PRN
  Administered 2019-06-11: 40 mg via INTRAVENOUS

## 2019-06-11 MED ORDER — PHENYLEPHRINE HCL (PRESSORS) 10 MG/ML IV SOLN
INTRAVENOUS | Status: DC | PRN
  Administered 2019-06-11: 80 ug via INTRAVENOUS
  Administered 2019-06-11: 40 ug via INTRAVENOUS
  Administered 2019-06-11: 80 ug via INTRAVENOUS
  Administered 2019-06-11: 40 ug via INTRAVENOUS
  Administered 2019-06-11 (×2): 80 ug via INTRAVENOUS

## 2019-06-11 MED ORDER — ASPIRIN 81 MG PO CHEW
81.0000 mg | CHEWABLE_TABLET | Freq: Two times a day (BID) | ORAL | Status: DC
  Administered 2019-06-11 – 2019-06-13 (×4): 81 mg via ORAL
  Filled 2019-06-11 (×4): qty 1

## 2019-06-11 MED ORDER — DOCUSATE SODIUM 100 MG PO CAPS
100.0000 mg | ORAL_CAPSULE | Freq: Two times a day (BID) | ORAL | Status: DC
  Administered 2019-06-11 – 2019-06-13 (×4): 100 mg via ORAL
  Filled 2019-06-11 (×4): qty 1

## 2019-06-11 MED ORDER — PANTOPRAZOLE SODIUM 40 MG PO TBEC
40.0000 mg | DELAYED_RELEASE_TABLET | Freq: Every day | ORAL | Status: DC
  Administered 2019-06-12 – 2019-06-13 (×2): 40 mg via ORAL
  Filled 2019-06-11 (×2): qty 1

## 2019-06-11 MED ORDER — 0.9 % SODIUM CHLORIDE (POUR BTL) OPTIME
TOPICAL | Status: DC | PRN
  Administered 2019-06-11: 1000 mL

## 2019-06-11 MED ORDER — PHENOL 1.4 % MT LIQD: 1.0000 | OROMUCOSAL | Status: DC | PRN

## 2019-06-11 MED ORDER — ALUM & MAG HYDROXIDE-SIMETH 200-200-20 MG/5ML PO SUSP: 30.0000 mL | ORAL | Status: DC | PRN

## 2019-06-11 MED ORDER — POVIDONE-IODINE 10 % EX SWAB: 2.0000 "application " | Freq: Once | CUTANEOUS | Status: DC

## 2019-06-11 MED ORDER — CEFAZOLIN SODIUM-DEXTROSE 1-4 GM/50ML-% IV SOLN
1.0000 g | Freq: Four times a day (QID) | INTRAVENOUS | Status: AC
  Administered 2019-06-11 – 2019-06-12 (×2): 1 g via INTRAVENOUS
  Filled 2019-06-11 (×2): qty 50

## 2019-06-11 MED ORDER — FENTANYL CITRATE (PF) 100 MCG/2ML IJ SOLN
INTRAMUSCULAR | Status: AC
  Filled 2019-06-11: qty 2

## 2019-06-11 MED ORDER — MORPHINE SULFATE (PF) 2 MG/ML IV SOLN
0.5000 mg | INTRAVENOUS | Status: DC | PRN
  Administered 2019-06-11: 1 mg via INTRAVENOUS
  Filled 2019-06-11: qty 1

## 2019-06-11 MED ORDER — ZOLPIDEM TARTRATE 5 MG PO TABS: 5.0000 mg | ORAL_TABLET | Freq: Every evening | ORAL | Status: DC | PRN

## 2019-06-11 MED ORDER — METOCLOPRAMIDE HCL 5 MG/ML IJ SOLN: 5.0000 mg | Freq: Three times a day (TID) | INTRAMUSCULAR | Status: DC | PRN

## 2019-06-11 MED ORDER — POLYETHYL GLYCOL-PROPYL GLYCOL 0.4-0.3 % OP SOLN: 1.0000 [drp] | Freq: Every day | OPHTHALMIC | Status: DC | PRN

## 2019-06-11 MED ORDER — ONDANSETRON HCL 4 MG/2ML IJ SOLN
4.0000 mg | Freq: Four times a day (QID) | INTRAMUSCULAR | Status: DC | PRN
  Administered 2019-06-11 – 2019-06-13 (×2): 4 mg via INTRAVENOUS
  Filled 2019-06-11 (×2): qty 2

## 2019-06-11 MED ORDER — FENTANYL CITRATE (PF) 250 MCG/5ML IJ SOLN
INTRAMUSCULAR | Status: DC | PRN
  Administered 2019-06-11: 50 ug via INTRAVENOUS
  Administered 2019-06-11: 25 ug via INTRAVENOUS

## 2019-06-11 MED ORDER — PROPOFOL 10 MG/ML IV BOLUS
INTRAVENOUS | Status: DC | PRN
  Administered 2019-06-11 (×2): 10 mg via INTRAVENOUS
  Administered 2019-06-11: 11 mg via INTRAVENOUS

## 2019-06-11 MED ORDER — LACTATED RINGERS IV SOLN
INTRAVENOUS | Status: DC
  Administered 2019-06-11 (×2): via INTRAVENOUS

## 2019-06-11 MED ORDER — PROPOFOL 500 MG/50ML IV EMUL
INTRAVENOUS | Status: DC | PRN
  Administered 2019-06-11: 25 ug/kg/min via INTRAVENOUS

## 2019-06-11 MED ORDER — HYDROCODONE-ACETAMINOPHEN 7.5-325 MG PO TABS
1.0000 | ORAL_TABLET | ORAL | Status: DC | PRN
  Administered 2019-06-11 – 2019-06-12 (×2): 2 via ORAL
  Filled 2019-06-11 (×2): qty 2

## 2019-06-11 MED ORDER — ALPRAZOLAM 0.25 MG PO TABS
0.2500 mg | ORAL_TABLET | Freq: Every day | ORAL | Status: DC | PRN
  Administered 2019-06-12: 0.25 mg via ORAL
  Filled 2019-06-11 (×2): qty 1

## 2019-06-11 MED ORDER — CEFAZOLIN SODIUM-DEXTROSE 2-4 GM/100ML-% IV SOLN
2.0000 g | INTRAVENOUS | Status: AC
  Administered 2019-06-11: 2 g via INTRAVENOUS

## 2019-06-11 MED ORDER — PROMETHAZINE HCL 25 MG/ML IJ SOLN
6.2500 mg | INTRAMUSCULAR | Status: DC | PRN
  Administered 2019-06-11: 18:00:00 6.25 mg via INTRAVENOUS

## 2019-06-11 MED ORDER — ONDANSETRON HCL 4 MG PO TABS: 4.0000 mg | ORAL_TABLET | Freq: Four times a day (QID) | ORAL | Status: DC | PRN

## 2019-06-11 MED ORDER — SODIUM CHLORIDE 0.9 % IV SOLN
INTRAVENOUS | Status: DC
  Administered 2019-06-12 (×2): via INTRAVENOUS

## 2019-06-11 MED ORDER — FENTANYL CITRATE (PF) 250 MCG/5ML IJ SOLN
INTRAMUSCULAR | Status: AC
  Filled 2019-06-11: qty 5

## 2019-06-11 SURGICAL SUPPLY — 61 items
APL SKNCLS STERI-STRIP NONHPOA (GAUZE/BANDAGES/DRESSINGS)
BENZOIN TINCTURE PRP APPL 2/3 (GAUZE/BANDAGES/DRESSINGS) ×1 IMPLANT
BLADE CLIPPER SURG (BLADE) IMPLANT
BLADE SAW SGTL 18X1.27X75 (BLADE) ×2 IMPLANT
BLADE SAW SGTL 18X1.27X75MM (BLADE) ×1
CLOSURE WOUND 1/2 X4 (GAUZE/BANDAGES/DRESSINGS)
COVER SURGICAL LIGHT HANDLE (MISCELLANEOUS) ×3 IMPLANT
COVER WAND RF STERILE (DRAPES) ×3 IMPLANT
CUP ACET PINNACLE SECTR 48MM (Joint) IMPLANT
DRAPE C-ARM 42X72 X-RAY (DRAPES) ×3 IMPLANT
DRAPE STERI IOBAN 125X83 (DRAPES) ×3 IMPLANT
DRAPE U-SHAPE 47X51 STRL (DRAPES) ×9 IMPLANT
DRESSING AQUACEL AG SP 3.5X10 (GAUZE/BANDAGES/DRESSINGS) IMPLANT
DRSG AQUACEL AG ADV 3.5X10 (GAUZE/BANDAGES/DRESSINGS) ×3 IMPLANT
DRSG AQUACEL AG SP 3.5X10 (GAUZE/BANDAGES/DRESSINGS) ×3
DURAPREP 26ML APPLICATOR (WOUND CARE) ×3 IMPLANT
ELECT BLADE 4.0 EZ CLEAN MEGAD (MISCELLANEOUS) ×3
ELECT BLADE 6.5 EXT (BLADE) ×2 IMPLANT
ELECT REM PT RETURN 9FT ADLT (ELECTROSURGICAL) ×3
ELECTRODE BLDE 4.0 EZ CLN MEGD (MISCELLANEOUS) ×1 IMPLANT
ELECTRODE REM PT RTRN 9FT ADLT (ELECTROSURGICAL) ×1 IMPLANT
FACESHIELD WRAPAROUND (MASK) ×6 IMPLANT
FACESHIELD WRAPAROUND OR TEAM (MASK) ×2 IMPLANT
GAUZE XEROFORM 1X8 LF (GAUZE/BANDAGES/DRESSINGS) ×2 IMPLANT
GLOVE BIOGEL PI IND STRL 8 (GLOVE) ×2 IMPLANT
GLOVE BIOGEL PI INDICATOR 8 (GLOVE) ×4
GLOVE ECLIPSE 8.0 STRL XLNG CF (GLOVE) ×3 IMPLANT
GLOVE ORTHO TXT STRL SZ7.5 (GLOVE) ×6 IMPLANT
GOWN STRL REUS W/ TWL LRG LVL3 (GOWN DISPOSABLE) ×2 IMPLANT
GOWN STRL REUS W/ TWL XL LVL3 (GOWN DISPOSABLE) ×2 IMPLANT
GOWN STRL REUS W/TWL LRG LVL3 (GOWN DISPOSABLE) ×6
GOWN STRL REUS W/TWL XL LVL3 (GOWN DISPOSABLE) ×6
HANDPIECE INTERPULSE COAX TIP (DISPOSABLE) ×3
HEAD FEM STD 32X+1 STRL (Hips) ×2 IMPLANT
KIT BASIN OR (CUSTOM PROCEDURE TRAY) ×3 IMPLANT
KIT TURNOVER KIT B (KITS) ×3 IMPLANT
MANIFOLD NEPTUNE II (INSTRUMENTS) ×5 IMPLANT
NS IRRIG 1000ML POUR BTL (IV SOLUTION) ×3 IMPLANT
PACK TOTAL JOINT (CUSTOM PROCEDURE TRAY) ×3 IMPLANT
PAD ARMBOARD 7.5X6 YLW CONV (MISCELLANEOUS) ×3 IMPLANT
PINN ALTRX NEUT ID X OD 32X48 ×2 IMPLANT
PINNSECTOR W/GRIP ACE CUP 48MM (Joint) ×3 IMPLANT
SCREW 6.5MMX25MM (Screw) ×2 IMPLANT
SET HNDPC FAN SPRY TIP SCT (DISPOSABLE) ×1 IMPLANT
STAPLER VISISTAT 35W (STAPLE) IMPLANT
STEM CORAIL KA11 (Stem) ×2 IMPLANT
STRIP CLOSURE SKIN 1/2X4 (GAUZE/BANDAGES/DRESSINGS) ×2 IMPLANT
SUT ETHIBOND NAB CT1 #1 30IN (SUTURE) ×3 IMPLANT
SUT MNCRL AB 4-0 PS2 18 (SUTURE) IMPLANT
SUT VIC AB 0 CT1 27 (SUTURE) ×3
SUT VIC AB 0 CT1 27XBRD ANBCTR (SUTURE) ×1 IMPLANT
SUT VIC AB 1 CT1 27 (SUTURE) ×3
SUT VIC AB 1 CT1 27XBRD ANBCTR (SUTURE) ×1 IMPLANT
SUT VIC AB 2-0 CT1 27 (SUTURE) ×3
SUT VIC AB 2-0 CT1 TAPERPNT 27 (SUTURE) ×1 IMPLANT
TOWEL GREEN STERILE (TOWEL DISPOSABLE) ×3 IMPLANT
TOWEL GREEN STERILE FF (TOWEL DISPOSABLE) ×3 IMPLANT
TRAY CATH 16FR W/PLASTIC CATH (SET/KITS/TRAYS/PACK) ×2 IMPLANT
TRAY FOLEY W/BAG SLVR 16FR (SET/KITS/TRAYS/PACK)
TRAY FOLEY W/BAG SLVR 16FR ST (SET/KITS/TRAYS/PACK) IMPLANT
WATER STERILE IRR 1000ML POUR (IV SOLUTION) ×2 IMPLANT

## 2019-06-11 NOTE — Anesthesia Preprocedure Evaluation (Signed)
Anesthesia Evaluation  Patient identified by MRN, date of birth, ID band Patient awake    Reviewed: Allergy & Precautions, NPO status , Patient's Chart, lab work & pertinent test results  Airway Mallampati: II  TM Distance: >3 FB Neck ROM: Full    Dental  (+) Dental Advisory Given   Pulmonary neg pulmonary ROS,    breath sounds clear to auscultation       Cardiovascular negative cardio ROS   Rhythm:Regular Rate:Normal     Neuro/Psych negative neurological ROS     GI/Hepatic negative GI ROS, Neg liver ROS,   Endo/Other  negative endocrine ROS  Renal/GU negative Renal ROS     Musculoskeletal  (+) Arthritis ,   Abdominal   Peds  Hematology negative hematology ROS (+)   Anesthesia Other Findings   Reproductive/Obstetrics                             Lab Results  Component Value Date   WBC 9.4 06/07/2019   HGB 14.3 06/07/2019   HCT 44.8 06/07/2019   MCV 85.8 06/07/2019   PLT 237 06/07/2019   Lab Results  Component Value Date   CREATININE 0.67 06/07/2019   BUN 8 06/07/2019   NA 140 06/07/2019   K 4.1 06/07/2019   CL 106 06/07/2019   CO2 25 06/07/2019    Anesthesia Physical Anesthesia Plan  ASA: II  Anesthesia Plan: Spinal   Post-op Pain Management:    Induction:   PONV Risk Score and Plan: 2 and Propofol infusion, Ondansetron and Treatment may vary due to age or medical condition  Airway Management Planned: Natural Airway and Simple Face Mask  Additional Equipment:   Intra-op Plan:   Post-operative Plan:   Informed Consent: I have reviewed the patients History and Physical, chart, labs and discussed the procedure including the risks, benefits and alternatives for the proposed anesthesia with the patient or authorized representative who has indicated his/her understanding and acceptance.       Plan Discussed with:   Anesthesia Plan Comments:          Anesthesia Quick Evaluation

## 2019-06-11 NOTE — H&P (Signed)
TOTAL HIP ADMISSION H&P  Patient is admitted for left total hip arthroplasty.  Subjective:  Chief Complaint: left hip pain  HPI: Catherine Trujillo, 78 y.o. female, has a history of pain and functional disability in the left hip(s) due to arthritis and patient has failed non-surgical conservative treatments for greater than 12 weeks to include NSAID's and/or analgesics, corticosteriod injections, flexibility and strengthening excercises, supervised PT with diminished ADL's post treatment, use of assistive devices and activity modification.  Onset of symptoms was gradual starting 3 years ago with gradually worsening course since that time.The patient noted no past surgery on the left hip(s).  Patient currently rates pain in the left hip at 10 out of 10 with activity. Patient has night pain, worsening of pain with activity and weight bearing, trendelenberg gait, pain that interfers with activities of daily living and pain with passive range of motion. Patient has evidence of subchondral cysts, subchondral sclerosis, periarticular osteophytes and joint space narrowing by imaging studies. This condition presents safety issues increasing the risk of falls.  There is no current active infection.  Patient Active Problem List   Diagnosis Date Noted  . Unilateral primary osteoarthritis, left hip 05/14/2019   Past Medical History:  Diagnosis Date  . Asthma    As a child, Outgrew it as an adult    Past Surgical History:  Procedure Laterality Date  . HIP ARTHROPLASTY Right     Current Facility-Administered Medications  Medication Dose Route Frequency Provider Last Rate Last Dose  . tranexamic acid (CYKLOKAPRON) IVPB 1,000 mg  1,000 mg Intravenous To OR Mcarthur Rossetti, MD       No Known Allergies  Social History   Tobacco Use  . Smoking status: Never Smoker  . Smokeless tobacco: Never Used  Substance Use Topics  . Alcohol use: Yes    Comment: Occasional    No family history on file.    Review of Systems  Musculoskeletal: Positive for joint pain.  All other systems reviewed and are negative.   Objective:  Physical Exam  Constitutional: She is oriented to person, place, and time. She appears well-developed and well-nourished.  HENT:  Head: Normocephalic and atraumatic.  Eyes: Pupils are equal, round, and reactive to light. EOM are normal.  Neck: Normal range of motion. Neck supple.  Cardiovascular: Normal rate.  Respiratory: Effort normal and breath sounds normal.  GI: Soft. Bowel sounds are normal.  Musculoskeletal:     Left hip: She exhibits decreased range of motion, decreased strength, tenderness and bony tenderness.  Neurological: She is alert and oriented to person, place, and time.  Skin: Skin is warm and dry.  Psychiatric: She has a normal mood and affect.    Vital signs in last 24 hours:    Labs:   Estimated body mass index is 26.5 kg/m as calculated from the following:   Height as of 06/07/19: 5\' 4"  (1.626 m).   Weight as of 06/07/19: 70 kg.   Imaging Review Plain radiographs demonstrate severe degenerative joint disease of the left hip(s). The bone quality appears to be good for age and reported activity level.      Assessment/Plan:  End stage arthritis, left hip(s)  The patient history, physical examination, clinical judgement of the provider and imaging studies are consistent with end stage degenerative joint disease of the left hip(s) and total hip arthroplasty is deemed medically necessary. The treatment options including medical management, injection therapy, arthroscopy and arthroplasty were discussed at length. The risks and benefits  of total hip arthroplasty were presented and reviewed. The risks due to aseptic loosening, infection, stiffness, dislocation/subluxation,  thromboembolic complications and other imponderables were discussed.  The patient acknowledged the explanation, agreed to proceed with the plan and consent was signed.  Patient is being admitted for inpatient treatment for surgery, pain control, PT, OT, prophylactic antibiotics, VTE prophylaxis, progressive ambulation and ADL's and discharge planning.The patient is planning to be discharged home with home health services    Patient's anticipated LOS is less than 2 midnights, meeting these requirements: - Younger than 35 - Lives within 1 hour of care - Has a competent adult at home to recover with post-op recover - NO history of  - Chronic pain requiring opiods  - Diabetes  - Coronary Artery Disease  - Heart failure  - Heart attack  - Stroke  - DVT/VTE  - Cardiac arrhythmia  - Respiratory Failure/COPD  - Renal failure  - Anemia  - Advanced Liver disease

## 2019-06-11 NOTE — Brief Op Note (Signed)
06/11/2019  4:27 PM  PATIENT:  Catherine Trujillo  78 y.o. female  PRE-OPERATIVE DIAGNOSIS:  Osteoarthritis Left Hip  POST-OPERATIVE DIAGNOSIS:  Osteoarthritis Left Hip  PROCEDURE:  Procedure(s): LEFT TOTAL HIP ARTHROPLASTY ANTERIOR APPROACH (Left)  SURGEON:  Surgeon(s) and Role:    Mcarthur Rossetti, MD - Primary  PHYSICIAN ASSISTANT:  Benita Stabile, PA-C  ANESTHESIA:   spinal  EBL:  200 mL   COUNTS:  YES  DICTATION: .Other Dictation: Dictation Number [id number]  PLAN OF CARE: Admit to inpatient   PATIENT DISPOSITION:  PACU - hemodynamically stable.   Delay start of Pharmacological VTE agent (>24hrs) due to surgical blood loss or risk of bleeding: no

## 2019-06-11 NOTE — Op Note (Signed)
NAME: Catherine, Trujillo MEDICAL RECORD [id number] ACCOUNT [id number] DATE OF BIRTH:11/09/1940 FACILITY: [facility] LOCATION: [facility]PER[facility] PHYSICIAN:Ilyssa Grennan [place], MD  OPERATIVE REPORT  DATE OF PROCEDURE:  06/11/2019  PREOPERATIVE DIAGNOSIS:  Primary osteoarthritis and degenerative joint disease, left hip.  POSTOPERATIVE DIAGNOSIS:  Primary osteoarthritis and degenerative joint disease, left hip.  PROCEDURE:  Left total hip arthroplasty through direct anterior approach.  IMPLANTS:  DePuy Sector Gription acetabular component size 50, with a single screw, size 32+4 polyethylene liner, size 11 Corail femoral component with standard offset, size 32+1 metal hip ball.  SURGEON:  Lind Guest. Ninfa Linden, MD  ASSISTANT:  Erskine Emery, PA-C  ANESTHESIA:  Spinal.  ANTIBIOTICS:  Two grams IV Ancef.  ESTIMATED BLOOD LOSS:  350 mL  COMPLICATIONS:  None.  INDICATIONS:  The patient is a very pleasant 78 year old female with debilitating arthritis involving her left hip.  She has a remote history of a right total hip arthroplasty done through a posterior approach 12 years ago.  Her left hip pain is severe.   It is detrimentally affecting her mobility her activities of daily living and her quality of life.  Her x-rays do show complete loss of joint space with bone-on-bone wear.  Of note, she is significantly shorter on her left side than her right side and  she does ambulate with a cane.  At this point, she does wish to proceed with a total hip arthroplasty.  We talked to her about direct anterior hip surgery.  We did discuss the risk of acute blood loss anemia, nerve or vessel injury, fracture, infection,  dislocation, implant failure and DVT.  We talked about the goals being decreased pain, improve mobility and overall improve quality of life.  DESCRIPTION OF PROCEDURE:  After informed consent was obtained and appropriate left hip was marked.  She was brought to the operating room and sat up  on a stretcher where spinal anesthesia was then obtained.  She was then laid in supine position on a  stretcher.  Foley catheter was placed.  We were able to assess her leg length again and found her significantly shorter on the left operative side.  Traction boots were then placed on both of her feet and she was placed supine on the Hana fracture table,  the perineal post in place and both legs in line skeletal traction devices, but no traction applied.  Her left operative hip was prepped and draped with DuraPrep and sterile drapes.  A time-out was called to identify correct patient and correct left  hip.  I then made an incision just inferior and posterior to the anterior superior iliac spine and carried this obliquely down the leg.  We dissected down tensor fascia lata muscle.  Tensor fascia was then divided longitudinally to proceed with direct  anterior approach to the hip.  We identified and cauterized circumflex vessels and identified the hip capsule, opened up the hip capsule in an L-type format finding a moderate joint effusion and significant periarticular osteophytes around the femoral  head and neck.  We placed curved retractors around the medial and lateral femoral neck and then made our femoral neck cut with an oscillating saw and completed this with an osteotome.  This was proximal to the lesser trochanter.  We then placed a  corkscrew guide in the femoral head and removed the femoral heads entirety and found a wide area devoid of cartilage.  I then placed a bent Hohmann over the medial acetabular rim and removed remnants of the acetabular  labrum and other debris and then  began reaming under direct visualization from a size 43 reamer in stepwise increments to a size 47 with all reamers under direct visualization, the last reamer under direct fluoroscopy, so we could obtain our depth of reaming, our inclination and  anteversion.  We then placed the real DePuy Sector Gription acetabular  component size 48 and a single screw and a 32+4 polyethylene liner for that size acetabular component.  Attention was then turned to the femur.  With the leg externally rotated to 120  degrees, extended and adducted, we were placing Mueller retractor medially and Hohman retractor behind the greater trochanter, released lateral joint capsule and used a box-cutting osteotome to enter the femoral canal and a rongeur to lateralize then  began broaching using Corail broaching system from a size 8 going up to a size 11.  With the 11 in place, we trialed a standard offset femoral neck and a 32+1 hip ball.  We brought the leg back over and up and with traction and internal rotation,  reducing the pelvis and we were pleased with her leg length, offset, range of motion and stability on exam and assessing it fluoroscopically.  We then dislocated the hip and removed the trial components.  We placed the real Corail femoral component with  standard offset size 11 and the real 32+1 metal hip ball reviewed again reduced this in the acetabulum.  We were pleased with stability, leg length, offset, range of motion.  We then irrigated the soft tissue with normal saline solution using pulsatile  lavage.  We were able to close the joint capsule with interrupted #1 Ethibond suture.  We then closed the tensor fascia with #1 Vicryl followed by 0 Vicryl to close deep tissue, 2-0 Vicryl was used to close subcutaneous tissue and interrupted staples  were placed on the skin incision.  Xeroform and Aquacel dressing was applied.  She was taken off the Hana table and taken to recovery room in stable condition.  All final counts were correct.  There were no complications noted.  Note Benita Stabile, PA-C,  assisted in the entire case.  His assistance was crucial for facilitating all aspects of this case.  TN/NUANCE  D:06/11/2019 T:06/11/2019 JOB:[id number]

## 2019-06-11 NOTE — Anesthesia Procedure Notes (Addendum)
Spinal  Patient location during procedure: OR Start time: 06/11/2019 3:03 PM End time: 06/11/2019 3:11 PM Staffing Anesthesiologist: Nolon Nations, MD Performed: anesthesiologist  Preanesthetic Checklist Completed: patient identified, site marked, surgical consent, pre-op evaluation, timeout performed, IV checked, risks and benefits discussed and monitors and equipment checked Spinal Block Patient position: sitting Prep: site prepped and draped and DuraPrep Patient monitoring: heart rate, cardiac monitor, continuous pulse ox and blood pressure Approach: right paramedian Location: L2-3 Injection technique: single-shot Needle Needle type: Sprotte  Needle gauge: 24 G Needle length: 9 cm Assessment Sensory level: T4 Additional Notes Expiration date of kit checked and confirmed. Patient tolerated procedure well, without complications.

## 2019-06-11 NOTE — Transfer of Care (Signed)
Immediate Anesthesia Transfer of Care Note  Patient: Catherine Trujillo  Procedure(s) Performed: LEFT TOTAL HIP ARTHROPLASTY ANTERIOR APPROACH (Left Hip)  Patient Location: PACU  Anesthesia Type:Spinal  Level of Consciousness: awake, alert  and oriented  Airway & Oxygen Therapy: Patient Spontanous Breathing and Patient connected to nasal cannula oxygen  Post-op Assessment: Report given to RN and Post -op Vital signs reviewed and stable  Post vital signs: Reviewed and stable  Last Vitals:  Vitals Value Taken Time  BP 120/59 06/11/19 1650  Temp    Pulse 68 06/11/19 1651  Resp 12 06/11/19 1651  SpO2 100 % 06/11/19 1651  Vitals shown include unvalidated device data.  Last Pain:  Vitals:   06/11/19 1214  TempSrc:   PainSc: 8       Patients Stated Pain Goal: 2 (29/19/16 6060)  Complications: No apparent anesthesia complications

## 2019-06-11 NOTE — Anesthesia Procedure Notes (Signed)
Procedure Name: MAC Date/Time: 06/11/2019 3:00 PM Performed by: Mariea Clonts, CRNA Pre-anesthesia Checklist: Patient identified, Suction available, Patient being monitored and Timeout performed Patient Re-evaluated:Patient Re-evaluated prior to induction Oxygen Delivery Method: Nasal cannula

## 2019-06-12 ENCOUNTER — Encounter (HOSPITAL_COMMUNITY): Payer: Self-pay | Admitting: General Practice

## 2019-06-12 DIAGNOSIS — M25752 Osteophyte, left hip: Secondary | ICD-10-CM | POA: Diagnosis present

## 2019-06-12 DIAGNOSIS — D62 Acute posthemorrhagic anemia: Secondary | ICD-10-CM | POA: Diagnosis not present

## 2019-06-12 DIAGNOSIS — M1612 Unilateral primary osteoarthritis, left hip: Secondary | ICD-10-CM | POA: Diagnosis present

## 2019-06-12 DIAGNOSIS — Z9181 History of falling: Secondary | ICD-10-CM | POA: Diagnosis not present

## 2019-06-12 DIAGNOSIS — R4189 Other symptoms and signs involving cognitive functions and awareness: Secondary | ICD-10-CM | POA: Diagnosis present

## 2019-06-12 LAB — BASIC METABOLIC PANEL
Anion gap: 8 (ref 5–15)
BUN: 9 mg/dL (ref 8–23)
CO2: 25 mmol/L (ref 22–32)
Calcium: 8.4 mg/dL — ABNORMAL LOW (ref 8.9–10.3)
Chloride: 106 mmol/L (ref 98–111)
Creatinine, Ser: 0.68 mg/dL (ref 0.44–1.00)
GFR calc Af Amer: >60 mL/min (ref >60)
GFR calc non Af Amer: >60 mL/min (ref >60)
Glucose, Bld: 118 mg/dL — ABNORMAL HIGH (ref 70–99)
Potassium: 3.9 mmol/L (ref 3.5–5.1)
Sodium: 139 mmol/L (ref 135–145)

## 2019-06-12 LAB — CBC
HCT: 32.2 % — ABNORMAL LOW (ref 36.0–46.0)
Hemoglobin: 10.6 g/dL — ABNORMAL LOW (ref 12.0–15.0)
MCH: 27.7 pg (ref 26.0–34.0)
MCHC: 32.9 g/dL (ref 30.0–36.0)
MCV: 84.1 fL (ref 80.0–100.0)
Platelets: 168 10*3/uL (ref 150–400)
RBC: 3.83 MIL/uL — ABNORMAL LOW (ref 3.87–5.11)
RDW: 13.2 % (ref 11.5–15.5)
WBC: 11.2 10*3/uL — ABNORMAL HIGH (ref 4.0–10.5)
nRBC: 0 % (ref 0.0–0.2)

## 2019-06-12 NOTE — Progress Notes (Signed)
Orthopedic Tech Progress Note Patient Details:  Catherine Trujillo 1941/05/17 [id number]      Post Interventions Patient Tolerated: Well Instructions Provided: Care of device   Karolee Stamps 06/12/2019, 6:28 AM

## 2019-06-12 NOTE — Evaluation (Signed)
Physical Therapy Evaluation Patient Details Name: Catherine Trujillo MRN: [id number] DOB: 10/30/1941 Today's Date: 06/12/2019   History of Present Illness  Pt is a 78 y/o female s/p L THA, direct anterior. PMH including but not limited to asthma.  Clinical Impression  Pt presented supine in bed with HOB elevated, awake and willing to participate in therapy session. Prior to admission, pt reported that she ambulated with use of a cane and was independent with ADLs. Pt lives with her husband in a two level home (bed/bathroom on the first floor) with a few steps to enter. At the time of evaluation, pt required min A for bed mobility and transfers with min guard to ambulate a short distance with RW. Pt with some cognitive issues noted below, unsure if this is her baseline - nothing in pt's chart regarding any cognitive deficits. PT will continue to follow pt acutely to progress mobility as tolerated.   Pt would continue to benefit from skilled physical therapy services at this time while admitted and after d/c to address the below listed limitations in order to improve overall safety and independence with functional mobility.     Follow Up Recommendations Home health PT;Supervision/Assistance - 24 hour    Equipment Recommendations  Rolling walker with 5" wheels;3in1 (PT)    Recommendations for Other Services       Precautions / Restrictions Precautions Precautions: Fall Restrictions Weight Bearing Restrictions: Yes LLE Weight Bearing: Weight bearing as tolerated      Mobility  Bed Mobility Overal bed mobility: Needs Assistance Bed Mobility: Supine to Sit     Supine to sit: Min assist     General bed mobility comments: increased time and effort, cueing for sequencing, assistance needed for L LE movement off of bed and for trunk elevation  Transfers Overall transfer level: Needs assistance Equipment used: Rolling walker (2 wheeled) Transfers: Sit to/from Stand Sit to Stand: Min  assist         General transfer comment: cueing for technique and safe hand placement, assistance needed to power into standing from EOB  Ambulation/Gait Ambulation/Gait assistance: Min guard Gait Distance (Feet): 20 Feet Assistive device: Rolling walker (2 wheeled) Gait Pattern/deviations: Step-to pattern;Decreased step length - right;Decreased step length - left;Decreased stride length;Decreased stance time - left;Decreased weight shift to left Gait velocity: decreased   General Gait Details: pt slow, guarded and cautious with ambulation; pt steady with RW, min guard for safety; no LOB or instability  Stairs            Wheelchair Mobility    Modified Rankin (Stroke Patients Only)       Balance Overall balance assessment: Needs assistance Sitting-balance support: Feet supported Sitting balance-Leahy Scale: Good     Standing balance support: Bilateral upper extremity supported Standing balance-Leahy Scale: Poor                               Pertinent Vitals/Pain Pain Assessment: 0-10 Pain Score: 6  Pain Location: L hip Pain Descriptors / Indicators: Sore Pain Intervention(s): Monitored during session;Repositioned    Home Living Family/patient expects to be discharged to:: Private residence Living Arrangements: Spouse/significant other Available Help at Discharge: Family;Available 24 hours/day Type of Home: House Home Access: Stairs to enter Entrance Stairs-Rails: Psychiatric nurse of Steps: 3 Home Layout: Able to live on main level with bedroom/bathroom Home Equipment: Shower seat - built in;Cane - single point      Prior  Function Level of Independence: Independent with assistive device(s)         Comments: ambulates with use of a cane     Hand Dominance        Extremity/Trunk Assessment   Upper Extremity Assessment Upper Extremity Assessment: Overall WFL for tasks assessed    Lower Extremity Assessment Lower  Extremity Assessment: LLE deficits/detail LLE Deficits / Details: pt with decreased strength and limited ROM secondary to post-op pain and weakness       Communication   Communication: No difficulties  Cognition Arousal/Alertness: Awake/alert Behavior During Therapy: Anxious;Restless Overall Cognitive Status: Impaired/Different from baseline Area of Impairment: Orientation;Attention;Memory;Safety/judgement;Following commands;Problem solving                 Orientation Level: Disoriented to;Situation;Time Current Attention Level: Sustained Memory: Decreased short-term memory;Decreased recall of precautions Following Commands: Follows one step commands with increased time Safety/Judgement: Decreased awareness of deficits;Decreased awareness of safety   Problem Solving: Difficulty sequencing;Requires verbal cues;Requires tactile cues        General Comments      Exercises     Assessment/Plan    PT Assessment Patient needs continued PT services  PT Problem List Decreased strength;Decreased range of motion;Decreased balance;Decreased activity tolerance;Decreased mobility;Decreased coordination;Decreased cognition;Decreased knowledge of use of DME;Decreased safety awareness;Decreased knowledge of precautions;Pain       PT Treatment Interventions DME instruction;Gait training;Stair training;Functional mobility training;Therapeutic exercise;Balance training;Therapeutic activities;Neuromuscular re-education;Cognitive remediation;Patient/family education    PT Goals (Current goals can be found in the Care Plan section)  Acute Rehab PT Goals Patient Stated Goal: "to go home" PT Goal Formulation: With patient Time For Goal Achievement: 06/26/19 Potential to Achieve Goals: Good    Frequency 7X/week   Barriers to discharge        Co-evaluation               AM-PAC PT "6 Clicks" Mobility  Outcome Measure Help needed turning from your back to your side while in a  flat bed without using bedrails?: A Little Help needed moving from lying on your back to sitting on the side of a flat bed without using bedrails?: A Little Help needed moving to and from a bed to a chair (including a wheelchair)?: A Little Help needed standing up from a chair using your arms (e.g., wheelchair or bedside chair)?: A Little Help needed to walk in hospital room?: A Little Help needed climbing 3-5 steps with a railing? : A Lot 6 Click Score: 17    End of Session Equipment Utilized During Treatment: Gait belt Activity Tolerance: Patient tolerated treatment well Patient left: in chair;with call bell/phone within reach;with chair alarm set Nurse Communication: Mobility status PT Visit Diagnosis: Other abnormalities of gait and mobility (R26.89);Pain Pain - Right/Left: Left Pain - part of body: Hip    Time: 7622-6333 PT Time Calculation (min) (ACUTE ONLY): 29 min   Charges:   PT Evaluation $PT Eval Moderate Complexity: 1 Mod PT Treatments $Gait Training: 8-22 mins        Sherie Don, PT, DPT  Acute Rehabilitation Services Pager [phone] Office [place] 06/12/2019, 1:06 PM

## 2019-06-12 NOTE — Progress Notes (Signed)
Physical Therapy Treatment Patient Details Name: Catherine Trujillo MRN: [id number] DOB: 03-06-1941 Today's Date: 06/12/2019    History of Present Illness Pt is a 78 y/o female s/p L THA, direct anterior. PMH including but not limited to asthma.    PT Comments    Pt making steady progress with functional mobility this session. Her husband was present throughout session as well. Plan for stair training at next session.  Pt would continue to benefit from skilled physical therapy services at this time while admitted and after d/c to address the below listed limitations in order to improve overall safety and independence with functional mobility.    Follow Up Recommendations  Home health PT;Supervision/Assistance - 24 hour     Equipment Recommendations  Rolling walker with 5" wheels;3in1 (PT)    Recommendations for Other Services       Precautions / Restrictions Precautions Precautions: Fall Restrictions Weight Bearing Restrictions: Yes LLE Weight Bearing: Weight bearing as tolerated    Mobility  Bed Mobility               General bed mobility comments: pt seated OOB in recliner chair upon arrival  Transfers Overall transfer level: Needs assistance Equipment used: Rolling walker (2 wheeled) Transfers: Sit to/from Stand Sit to Stand: Min assist         General transfer comment: good technique, assistance needed to power into standing and for stability as pt with posterior weight shift initially in standing  Ambulation/Gait Ambulation/Gait assistance: Min guard Gait Distance (Feet): 100 Feet Assistive device: Rolling walker (2 wheeled) Gait Pattern/deviations: Decreased step length - right;Decreased step length - left;Decreased stride length;Decreased weight shift to left;Decreased stance time - left Gait velocity: decreased   General Gait Details: pt slow, guarded and cautious with ambulation; pt steady with RW, min guard for safety; no LOB or  instability   Stairs             Wheelchair Mobility    Modified Rankin (Stroke Patients Only)       Balance Overall balance assessment: Needs assistance Sitting-balance support: Feet supported Sitting balance-Leahy Scale: Good     Standing balance support: Bilateral upper extremity supported Standing balance-Leahy Scale: Poor                              Cognition Arousal/Alertness: Awake/alert Behavior During Therapy: Anxious;Restless Overall Cognitive Status: Impaired/Different from baseline Area of Impairment: Orientation;Attention;Memory;Safety/judgement;Following commands;Problem solving                 Orientation Level: Disoriented to;Situation;Time Current Attention Level: Sustained Memory: Decreased short-term memory;Decreased recall of precautions Following Commands: Follows one step commands with increased time Safety/Judgement: Decreased awareness of deficits;Decreased awareness of safety   Problem Solving: Difficulty sequencing;Requires verbal cues;Requires tactile cues        Exercises Total Joint Exercises Long Arc Quad: Seated;AROM;Strengthening;10 reps;Both Marching in Standing: Seated;AROM;Strengthening;Both;10 reps General Exercises - Lower Extremity Mini-Sqauts: AROM;Strengthening;10 reps;Standing    General Comments        Pertinent Vitals/Pain Pain Assessment: Faces Faces Pain Scale: Hurts even more Pain Location: L hip Pain Descriptors / Indicators: Sore Pain Intervention(s): Monitored during session;Repositioned    Home Living                      Prior Function            PT Goals (current goals can now be found in the care  plan section) Acute Rehab PT Goals PT Goal Formulation: With patient Time For Goal Achievement: 06/26/19 Potential to Achieve Goals: Good Progress towards PT goals: Progressing toward goals    Frequency    7X/week      PT Plan Current plan remains appropriate     Co-evaluation              AM-PAC PT "6 Clicks" Mobility   Outcome Measure  Help needed turning from your back to your side while in a flat bed without using bedrails?: A Little Help needed moving from lying on your back to sitting on the side of a flat bed without using bedrails?: A Little Help needed moving to and from a bed to a chair (including a wheelchair)?: A Little Help needed standing up from a chair using your arms (e.g., wheelchair or bedside chair)?: A Little Help needed to walk in hospital room?: A Little Help needed climbing 3-5 steps with a railing? : A Lot 6 Click Score: 17    End of Session Equipment Utilized During Treatment: Gait belt Activity Tolerance: Patient tolerated treatment well Patient left: in chair;with call bell/phone within reach;with chair alarm set;with family/visitor present Nurse Communication: Mobility status PT Visit Diagnosis: Other abnormalities of gait and mobility (R26.89);Pain Pain - Right/Left: Left Pain - part of body: Hip     Time: 8875-7972 PT Time Calculation (min) (ACUTE ONLY): 41 min  Charges:  $Gait Training: 23-37 mins $Therapeutic Exercise: 8-22 mins                     Sherie Don, PT, DPT  Acute Rehabilitation Services Pager [phone] Office [place] 06/12/2019, 4:54 PM

## 2019-06-12 NOTE — Anesthesia Postprocedure Evaluation (Signed)
Anesthesia Post Note  Patient: Catherine Trujillo  Procedure(s) Performed: LEFT TOTAL HIP ARTHROPLASTY ANTERIOR APPROACH (Left Hip)     Patient location during evaluation: PACU Anesthesia Type: Spinal Level of consciousness: awake and alert Pain management: pain level controlled Vital Signs Assessment: post-procedure vital signs reviewed and stable Respiratory status: spontaneous breathing and respiratory function stable Cardiovascular status: blood pressure returned to baseline and stable Postop Assessment: spinal receding Anesthetic complications: no    Last Vitals:  Vitals:   06/12/19 0840 06/12/19 0841  BP: (!) 141/73   Pulse: 95   Resp: 16   Temp:  36.8 C  SpO2: 96%     Last Pain:  Vitals:   06/12/19 0841  TempSrc: Oral  PainSc:                  Tiajuana Amass

## 2019-06-12 NOTE — Progress Notes (Signed)
Subjective: 1 Day Post-Op Procedure(s) (LRB): LEFT TOTAL HIP ARTHROPLASTY ANTERIOR APPROACH (Left) Patient reports pain as moderate.  Awake and alert, but some confusion.  Her husband stated to me yesterday that is her new baseline and has had some cognitive issues.  She does have acute blood loss anemia from her surgery, but tolerating well thus far.  Objective: Vital signs in last 24 hours: Temp:  [96.9 F (36.1 C)-98.3 F (36.8 C)] 98.1 F (36.7 C) (08/12 0344) Pulse Rate:  [65-94] 92 (08/12 0344) Resp:  [14-22] 14 (08/12 0344) BP: (110-171)/(59-89) 117/59 (08/12 0344) SpO2:  [97 %-100 %] 97 % (08/12 0344) Weight:  [63.5 kg] 63.5 kg (08/11 1209)  Intake/Output from previous day: 08/11 0701 - 08/12 0700 In: 1000 [I.V.:1000] Out: 800 [Urine:600; Blood:200] Intake/Output this shift: No intake/output data recorded.  Recent Labs    06/12/19 0356  HGB 10.6*   Recent Labs    06/12/19 0356  WBC 11.2*  RBC 3.83*  HCT 32.2*  PLT 168   Recent Labs    06/12/19 0356  NA 139  K 3.9  CL 106  CO2 25  BUN 9  CREATININE 0.68  GLUCOSE 118*  CALCIUM 8.4*   No results for input(s): LABPT, INR in the last 72 hours.  Sensation intact distally Intact pulses distally Dorsiflexion/Plantar flexion intact Incision: scant drainage   Assessment/Plan: 1 Day Post-Op Procedure(s) (LRB): LEFT TOTAL HIP ARTHROPLASTY ANTERIOR APPROACH (Left) Up with therapy Plan for discharge tomorrow Discharge home with home health  She is too much of a fall risk to send home today.  Did not get any therapy yesterday due to surgery being late in the day.  She will need both therapy sessions today and tomorrow prior to discharge for safety purposes.  Her husband will also need to be allowed by the bedside to work with directions from therapy.    Mcarthur Rossetti 06/12/2019, 7:30 AM

## 2019-06-13 MED ORDER — HYDROCODONE-ACETAMINOPHEN 5-325 MG PO TABS: 1.0000 | ORAL_TABLET | Freq: Four times a day (QID) | ORAL | 0 refills | Status: DC | PRN

## 2019-06-13 MED ORDER — ASPIRIN 81 MG PO CHEW: 81.0000 mg | CHEWABLE_TABLET | Freq: Two times a day (BID) | ORAL | 0 refills | Status: AC

## 2019-06-13 NOTE — Progress Notes (Signed)
Physical Therapy Treatment Patient Details Name: Catherine Trujillo MRN: [id number] DOB: 01/07/1941 Today's Date: 06/13/2019    History of Present Illness Pt is a 78 y/o female s/p L THA, direct anterior. PMH including but not limited to asthma.    PT Comments    Pt performed gt training and functional mobility during session.  She continues to require constant redirection as she remains easily distracted with short term memory deficits.  PTA reviewed HEP and issued handout to her husband for home use.  At this time patient is declining HHPT due to [place] pandemic.  She and her husband are okay with this decision as well at dr. Ninfa Linden.  Pt continues to wait for RW and BSC to be delivered for home use.    Follow Up Recommendations  Home health PT;Supervision/Assistance - 24 hour     Equipment Recommendations  Rolling walker with 5" wheels;3in1 (PT)    Recommendations for Other Services       Precautions / Restrictions Precautions Precautions: Fall Restrictions Weight Bearing Restrictions: Yes LLE Weight Bearing: Weight bearing as tolerated    Mobility  Bed Mobility Overal bed mobility: Needs Assistance Bed Mobility: Supine to Sit     Supine to sit: Min assist     General bed mobility comments: Pt standing in room on arrival.  Transfers Overall transfer level: Needs assistance Equipment used: Rolling walker (2 wheeled) Transfers: Sit to/from Stand Sit to Stand: Min guard         General transfer comment: Cues for hand placement and better balance noted.  Ambulation/Gait Ambulation/Gait assistance: Min guard Gait Distance (Feet): 200 Feet Assistive device: Rolling walker (2 wheeled) Gait Pattern/deviations: Decreased step length - right;Decreased step length - left;Decreased stride length;Decreased weight shift to left;Decreased stance time - left Gait velocity: decreased   General Gait Details: Cues to stay focused on task.  Pt easily  distracted.   Stairs Stairs: Yes Stairs assistance: Supervision Stair Management: Two rails;Forwards Number of Stairs: 4 General stair comments: Pt responds better to step by step cueing.  Husband present to observe technique.   Wheelchair Mobility    Modified Rankin (Stroke Patients Only)       Balance Overall balance assessment: Needs assistance Sitting-balance support: Feet supported Sitting balance-Leahy Scale: Good     Standing balance support: Bilateral upper extremity supported Standing balance-Leahy Scale: Poor                              Cognition Arousal/Alertness: Awake/alert Behavior During Therapy: Anxious;Restless Overall Cognitive Status: Impaired/Different from baseline Area of Impairment: Orientation;Attention;Memory;Safety/judgement;Following commands;Problem solving                 Orientation Level: Disoriented to;Situation;Time Current Attention Level: Sustained Memory: Decreased short-term memory;Decreased recall of precautions Following Commands: Follows one step commands with increased time Safety/Judgement: Decreased awareness of deficits;Decreased awareness of safety   Problem Solving: Difficulty sequencing;Requires verbal cues;Requires tactile cues General Comments: Continues to present with memory deficits.  Husband present for session this pm and able to recall what pt should be doing at home.      Exercises Total Joint Exercises Ankle Circles/Pumps: AROM;Both;20 reps;Supine Quad Sets: AROM;Left;10 reps;Supine Short Arc Quad: AROM;Left;10 reps;Supine Heel Slides: AROM;Left;10 reps;Supine Hip ABduction/ADduction: AROM;Left;10 reps;Supine Long Arc Quad: Seated;AROM;Strengthening;10 reps;Left Knee Flexion: AROM;Left;10 reps;Standing Marching in Standing: AROM;Left;10 reps;Standing Standing Hip Extension: AROM;Left;10 reps;Standing General Exercises - Lower Extremity Hip ABduction/ADduction: AROM;Left;10  reps;Standing    General  Comments        Pertinent Vitals/Pain Pain Assessment: Faces Pain Score: 6  Faces Pain Scale: Hurts even more Pain Location: L hip Pain Descriptors / Indicators: Sore Pain Intervention(s): Monitored during session;Repositioned    Home Living                      Prior Function            PT Goals (current goals can now be found in the care plan section) Acute Rehab PT Goals Patient Stated Goal: "to go home" Potential to Achieve Goals: Good Progress towards PT goals: Progressing toward goals    Frequency    7X/week      PT Plan Current plan remains appropriate    Co-evaluation              AM-PAC PT "6 Clicks" Mobility   Outcome Measure  Help needed turning from your back to your side while in a flat bed without using bedrails?: A Little Help needed moving from lying on your back to sitting on the side of a flat bed without using bedrails?: A Little Help needed moving to and from a bed to a chair (including a wheelchair)?: A Little Help needed standing up from a chair using your arms (e.g., wheelchair or bedside chair)?: A Little Help needed to walk in hospital room?: A Little Help needed climbing 3-5 steps with a railing? : A Lot 6 Click Score: 17    End of Session Equipment Utilized During Treatment: Gait belt Activity Tolerance: Patient tolerated treatment well Patient left: in chair;with call bell/phone within reach;with chair alarm set;with family/visitor present Nurse Communication: Mobility status PT Visit Diagnosis: Other abnormalities of gait and mobility (R26.89);Pain Pain - Right/Left: Left Pain - part of body: Hip     Time: 4627-0350 PT Time Calculation (min) (ACUTE ONLY): 23 min  Charges:  $Gait Training: 8-22 mins $Therapeutic Exercise: 8-22 mins                     Governor Rooks, PTA Acute Rehabilitation Services Pager [phone] Office [phone]     Shahed Yeoman Eli Hose 06/13/2019, 1:35  PM

## 2019-06-13 NOTE — Plan of Care (Signed)
  Problem: Education: Goal: Knowledge of General Education information will improve Description: Including pain rating scale, medication(s)/side effects and non-pharmacologic comfort measures 06/13/2019 1332 by Lurline Idol, RN Outcome: Adequate for Discharge 06/13/2019 1108 by Lurline Idol, RN Outcome: Progressing   Problem: Health Behavior/Discharge Planning: Goal: Ability to manage health-related needs will improve 06/13/2019 1332 by Lurline Idol, RN Outcome: Adequate for Discharge 06/13/2019 1108 by Lurline Idol, RN Outcome: Progressing   Problem: Clinical Measurements: Goal: Ability to maintain clinical measurements within normal limits will improve 06/13/2019 1332 by Lurline Idol, RN Outcome: Adequate for Discharge 06/13/2019 1108 by Lurline Idol, RN Outcome: Progressing Goal: Will remain free from infection 06/13/2019 1332 by Lurline Idol, RN Outcome: Adequate for Discharge 06/13/2019 1108 by Lurline Idol, RN Outcome: Progressing Goal: Diagnostic test results will improve 06/13/2019 1332 by Lurline Idol, RN Outcome: Adequate for Discharge 06/13/2019 1108 by Lurline Idol, RN Outcome: Progressing Goal: Respiratory complications will improve 06/13/2019 1332 by Lurline Idol, RN Outcome: Adequate for Discharge 06/13/2019 1108 by Lurline Idol, RN Outcome: Progressing Goal: Cardiovascular complication will be avoided 06/13/2019 1332 by Lurline Idol, RN Outcome: Adequate for Discharge 06/13/2019 1108 by Lurline Idol, RN Outcome: Progressing   Problem: Activity: Goal: Risk for activity intolerance will decrease 06/13/2019 1332 by Lurline Idol, RN Outcome: Adequate for Discharge 06/13/2019 1108 by Lurline Idol, RN Outcome: Progressing   Problem: Nutrition: Goal: Adequate nutrition will be maintained 06/13/2019 1332 by Lurline Idol, RN Outcome: Adequate for Discharge 06/13/2019 1108 by Lurline Idol, RN Outcome: Progressing   Problem:  Coping: Goal: Level of anxiety will decrease 06/13/2019 1332 by Lurline Idol, RN Outcome: Adequate for Discharge 06/13/2019 1108 by Lurline Idol, RN Outcome: Progressing   Problem: Elimination: Goal: Will not experience complications related to bowel motility 06/13/2019 1332 by Lurline Idol, RN Outcome: Adequate for Discharge 06/13/2019 1108 by Lurline Idol, RN Outcome: Progressing Goal: Will not experience complications related to urinary retention 06/13/2019 1332 by Lurline Idol, RN Outcome: Adequate for Discharge 06/13/2019 1108 by Lurline Idol, RN Outcome: Progressing   Problem: Pain Managment: Goal: General experience of comfort will improve 06/13/2019 1332 by Lurline Idol, RN Outcome: Adequate for Discharge 06/13/2019 1108 by Lurline Idol, RN Outcome: Progressing   Problem: Safety: Goal: Ability to remain free from injury will improve 06/13/2019 1332 by Lurline Idol, RN Outcome: Adequate for Discharge 06/13/2019 1108 by Lurline Idol, RN Outcome: Progressing   Problem: Skin Integrity: Goal: Risk for impaired skin integrity will decrease 06/13/2019 1332 by Lurline Idol, RN Outcome: Adequate for Discharge 06/13/2019 1108 by Lurline Idol, RN Outcome: Progressing

## 2019-06-13 NOTE — Discharge Instructions (Signed)

## 2019-06-13 NOTE — Plan of Care (Signed)

## 2019-06-13 NOTE — Progress Notes (Signed)
Physical Therapy Treatment Patient Details Name: Catherine Trujillo MRN: [id number] DOB: 07-26-1941 Today's Date: 06/13/2019    History of Present Illness Pt is a 78 y/o female s/p L THA, direct anterior. PMH including but not limited to asthma.    PT Comments    Pt performed gt training, stair training and LE exercises.  She required max VCs to recall sequencing.  Plan next session to review stair training and HEP.    Follow Up Recommendations  Home health PT;Supervision/Assistance - 24 hour     Equipment Recommendations  Rolling walker with 5" wheels;3in1 (PT)    Recommendations for Other Services       Precautions / Restrictions Precautions Precautions: Fall Restrictions Weight Bearing Restrictions: No LLE Weight Bearing: Weight bearing as tolerated    Mobility  Bed Mobility Overal bed mobility: Needs Assistance Bed Mobility: Supine to Sit     Supine to sit: Min assist     General bed mobility comments: Pt required assistance for trunk elevation and LE advancement to edge of bed.  Transfers Overall transfer level: Needs assistance Equipment used: Rolling walker (2 wheeled) Transfers: Sit to/from Stand Sit to Stand: Min assist         General transfer comment: Cues for hand placement to and from seated surface.  Pt pulling on RW to achieve standing and continues to present with posterior weight shift.  Ambulation/Gait Ambulation/Gait assistance: Min guard Gait Distance (Feet): 200 Feet Assistive device: Rolling walker (2 wheeled) Gait Pattern/deviations: Decreased step length - right;Decreased step length - left;Decreased stride length;Decreased weight shift to left;Decreased stance time - left     General Gait Details: pt slow, guarded and cautious with ambulation; pt steady with RW, min guard for safety; pt wanting to look behind her to see her husband but this makes her mildly unstable.   Stairs Stairs: Yes Stairs assistance: Min guard Stair  Management: Two rails;Forwards Number of Stairs: 12 General stair comments: Cues for sequencing and hand placement, required several repeated trials as she lacks STM to recll sequencing depsite repetiition.  LOB when turn at bottom of stair posteriorly.   Wheelchair Mobility    Modified Rankin (Stroke Patients Only)       Balance Overall balance assessment: Needs assistance   Sitting balance-Leahy Scale: Good       Standing balance-Leahy Scale: Poor                              Cognition Arousal/Alertness: Awake/alert Behavior During Therapy: Anxious;Restless Overall Cognitive Status: Impaired/Different from baseline Area of Impairment: Orientation;Attention;Memory;Safety/judgement;Following commands;Problem solving                 Orientation Level: Disoriented to;Situation;Time Current Attention Level: Sustained Memory: Decreased short-term memory;Decreased recall of precautions Following Commands: Follows one step commands with increased time Safety/Judgement: Decreased awareness of deficits;Decreased awareness of safety   Problem Solving: Difficulty sequencing;Requires verbal cues;Requires tactile cues        Exercises Total Joint Exercises Ankle Circles/Pumps: AROM;Both;20 reps;Supine Quad Sets: AROM;Left;10 reps;Supine Short Arc Quad: AROM;Left;10 reps;Supine Heel Slides: AROM;Left;10 reps;Supine Hip ABduction/ADduction: AROM;Left;10 reps;Supine    General Comments        Pertinent Vitals/Pain Pain Assessment: Faces Faces Pain Scale: Hurts even more Pain Location: L hip Pain Descriptors / Indicators: Sore Pain Intervention(s): Monitored during session;Repositioned    Home Living  Prior Function            PT Goals (current goals can now be found in the care plan section) Acute Rehab PT Goals Patient Stated Goal: "to go home" Potential to Achieve Goals: Good Progress towards PT goals: Progressing  toward goals    Frequency    7X/week      PT Plan Current plan remains appropriate    Co-evaluation              AM-PAC PT "6 Clicks" Mobility   Outcome Measure  Help needed turning from your back to your side while in a flat bed without using bedrails?: A Little Help needed moving from lying on your back to sitting on the side of a flat bed without using bedrails?: A Little Help needed moving to and from a bed to a chair (including a wheelchair)?: A Little Help needed standing up from a chair using your arms (e.g., wheelchair or bedside chair)?: A Little Help needed to walk in hospital room?: A Little Help needed climbing 3-5 steps with a railing? : A Lot 6 Click Score: 17    End of Session Equipment Utilized During Treatment: Gait belt Activity Tolerance: Patient tolerated treatment well Patient left: in chair;with call bell/phone within reach;with chair alarm set;with family/visitor present Nurse Communication: Mobility status PT Visit Diagnosis: Other abnormalities of gait and mobility (R26.89);Pain Pain - Right/Left: Left Pain - part of body: Hip     Time: 0737-1062 PT Time Calculation (min) (ACUTE ONLY): 23 min  Charges:  $Gait Training: 8-22 mins $Therapeutic Exercise: 8-22 mins                     Governor Rooks, PTA Acute Rehabilitation Services Pager [phone] Office [phone]     Torie Priebe Eli Hose 06/13/2019, 12:58 PM

## 2019-06-13 NOTE — Progress Notes (Signed)
Patient is declining HH due to chance of becoming infected with COVID  PT wants patient to go home with a walker and bedside commode due to confusion.

## 2019-06-13 NOTE — Progress Notes (Signed)
Subjective: 2 Days Post-Op Procedure(s) (LRB): LEFT TOTAL HIP ARTHROPLASTY ANTERIOR APPROACH (Left) Patient reports pain as moderate.    Objective: Vital signs in last 24 hours: Temp:  [98.2 F (36.8 C)-98.8 F (37.1 C)] 98.8 F (37.1 C) (08/13 0404) Pulse Rate:  [86-95] 86 (08/13 0404) Resp:  [14-16] 16 (08/13 0404) BP: (134-165)/(68-86) 134/68 (08/13 0404) SpO2:  [96 %-99 %] 96 % (08/13 0404)  Intake/Output from previous day: 08/12 0701 - 08/13 0700 In: 150 [P.O.:150] Out: 800 [Urine:800] Intake/Output this shift: No intake/output data recorded.  Recent Labs    06/12/19 0356  HGB 10.6*   Recent Labs    06/12/19 0356  WBC 11.2*  RBC 3.83*  HCT 32.2*  PLT 168   Recent Labs    06/12/19 0356  NA 139  K 3.9  CL 106  CO2 25  BUN 9  CREATININE 0.68  GLUCOSE 118*  CALCIUM 8.4*   No results for input(s): LABPT, INR in the last 72 hours.  Sensation intact distally Intact pulses distally Dorsiflexion/Plantar flexion intact Incision: dressing C/D/I   Assessment/Plan: 2 Days Post-Op Procedure(s) (LRB): LEFT TOTAL HIP ARTHROPLASTY ANTERIOR APPROACH (Left) Up with therapy Discharge home with home health this afternoon.      Catherine Trujillo 06/13/2019, 7:09 AM

## 2019-06-13 NOTE — Discharge Summary (Signed)
Patient ID: Catherine Trujillo MRN: [id number] DOB/AGE: 31-Jan-1941 78 y.o.  Admit date: 06/11/2019 Discharge date: 06/13/2019  Admission Diagnoses:  Principal Problem:   Unilateral primary osteoarthritis, left hip Active Problems:   Status post total replacement of left hip   Discharge Diagnoses:  Same  Past Medical History:  Diagnosis Date  . Asthma    As a child, Outgrew it as an adult    Surgeries: Procedure(s): LEFT TOTAL HIP ARTHROPLASTY ANTERIOR APPROACH on 06/11/2019   Consultants:   Discharged Condition: Improved  Hospital Course: Catherine Trujillo is an 78 y.o. female who was admitted 06/11/2019 for operative treatment ofUnilateral primary osteoarthritis, left hip. Patient has severe unremitting pain that affects sleep, daily activities, and work/hobbies. After pre-op clearance the patient was taken to the operating room on 06/11/2019 and underwent  Procedure(s): LEFT TOTAL HIP ARTHROPLASTY ANTERIOR APPROACH.    Patient was given perioperative antibiotics:  Anti-infectives (From admission, onward)   Start     Dose/Rate Route Frequency Ordered Stop   06/12/19 0600  ceFAZolin (ANCEF) IVPB 2g/100 mL premix     2 g 200 mL/hr over 30 Minutes Intravenous On call to O.R. 06/11/19 1201 06/11/19 1546   06/11/19 2100  ceFAZolin (ANCEF) IVPB 1 g/50 mL premix     1 g 100 mL/hr over 30 Minutes Intravenous Every 6 hours 06/11/19 1823 06/12/19 0518       Patient was given sequential compression devices, early ambulation, and chemoprophylaxis to prevent DVT.  Patient benefited maximally from hospital stay and there were no complications.    Recent vital signs:  Patient Vitals for the past 24 hrs:  BP Temp Temp src Pulse Resp SpO2  06/13/19 0404 134/68 98.8 F (37.1 C) - 86 16 96 %  06/12/19 2002 (!) 147/86 98.4 F (36.9 C) Oral 93 16 98 %  06/12/19 1426 (!) 165/81 98.6 F (37 C) Oral 86 14 99 %  06/12/19 0841 - 98.2 F (36.8 C) Oral - - -  06/12/19 0840 (!) 141/73 - - 95  16 96 %     Recent laboratory studies:  Recent Labs    06/12/19 0356  WBC 11.2*  HGB 10.6*  HCT 32.2*  PLT 168  NA 139  K 3.9  CL 106  CO2 25  BUN 9  CREATININE 0.68  GLUCOSE 118*  CALCIUM 8.4*     Discharge Medications:   Allergies as of 06/13/2019   No Known Allergies     Medication List    TAKE these medications   acetaminophen 500 MG tablet Commonly known as: TYLENOL Take 1,000 mg by mouth daily.   ALPRAZolam 0.5 MG tablet Commonly known as: XANAX Take 0.25 mg by mouth daily as needed for anxiety.   aspirin 81 MG chewable tablet Chew 1 tablet (81 mg total) by mouth 2 (two) times daily.   HYDROcodone-acetaminophen 5-325 MG tablet Commonly known as: NORCO/VICODIN Take 1-2 tablets by mouth every 6 (six) hours as needed for moderate pain (pain score 4-6).   Systane 0.4-0.3 % Soln Generic drug: Polyethyl Glycol-Propyl Glycol Place 1 drop into both eyes daily as needed (for dry eyes).            Durable Medical Equipment  (From admission, onward)         Start     Ordered   06/11/19 1823  DME Walker rolling  Once    Question:  Patient needs a walker to treat with the following condition  Answer:  Status post  total replacement of left hip   06/11/19 1823   06/11/19 1823  DME 3 n 1  Once     06/11/19 1823          Diagnostic Studies: Dg Pelvis Portable  Result Date: 06/11/2019 CLINICAL DATA:  Status post left hip replacement EXAM: PORTABLE PELVIS 1-2 VIEWS COMPARISON:  None. FINDINGS: Bilateral hip replacements are seen. No acute fracture or acute soft tissue abnormality is noted. IMPRESSION: Status post left hip replacement. Electronically Signed   By: Inez Catalina M.D.   On: 06/11/2019 18:46   Dg C-arm 1-60 Min  Result Date: 06/11/2019 CLINICAL DATA:  Left hip replacement EXAM: OPERATIVE LEFT HIP WITH PELVIS; DG C-ARM 61-120 MIN COMPARISON:  None. FLUOROSCOPY TIME:  Fluoroscopy Time:  24 seconds Radiation Exposure Index (if provided by the  fluoroscopic device): 0.84 mGy Number of Acquired Spot Images: 2 FINDINGS: Left hip replacement is noted in satisfactory position. No acute soft tissue or bony abnormality is noted. IMPRESSION: Status post left hip replacement. Electronically Signed   By: Inez Catalina M.D.   On: 06/11/2019 18:25   Dg Hip Operative Unilat W Or W/o Pelvis Left  Result Date: 06/11/2019 CLINICAL DATA:  Left hip replacement EXAM: OPERATIVE LEFT HIP WITH PELVIS; DG C-ARM 61-120 MIN COMPARISON:  None. FLUOROSCOPY TIME:  Fluoroscopy Time:  24 seconds Radiation Exposure Index (if provided by the fluoroscopic device): 0.84 mGy Number of Acquired Spot Images: 2 FINDINGS: Left hip replacement is noted in satisfactory position. No acute soft tissue or bony abnormality is noted. IMPRESSION: Status post left hip replacement. Electronically Signed   By: Inez Catalina M.D.   On: 06/11/2019 18:25   Xr Hip Unilat W Or W/o Pelvis 1v Left  Result Date: 05/14/2019 An AP pelvis and lateral of the left hip shows severe end-stage arthritis of the left hip.  There is flattening of the femoral head.  There is significant loss of the superior lateral joint space with joint space narrowing.  There are periarticular osteophytes around the hip as well as sclerotic changes in the femoral head.  There is a right total hip arthroplasty is on the AP view appears well-seated with no complicating features.   Disposition: Discharge disposition: 01-Home or Self Care         Follow-up Information    Mcarthur Rossetti, MD. Schedule an appointment as soon as possible for a visit in 2 week(s).   Specialty: Orthopedic Surgery Contact information: [place] [address] [phone]            Signed: Mcarthur Rossetti 06/13/2019, 7:11 AM

## 2019-06-25 ENCOUNTER — Ambulatory Visit (INDEPENDENT_AMBULATORY_CARE_PROVIDER_SITE_OTHER): Payer: PPO | Admitting: Orthopaedic Surgery

## 2019-06-25 ENCOUNTER — Encounter: Payer: Self-pay | Admitting: Orthopaedic Surgery

## 2019-06-25 DIAGNOSIS — Z96642 Presence of left artificial hip joint: Secondary | ICD-10-CM

## 2019-06-25 NOTE — Progress Notes (Signed)
HPI:Mrs. Otey comes in today status post left total hip arthroplasty she is overall doing well.  She is on aspirin 81 mg twice daily.  She is taking just Tylenol in addition for pain control.  She is having some low back pain but no radicular symptoms.  She feels that her range of motion of the hip and strength of the hip are improving.  She denies any fevers chills shortness of breath chest pain.  Physical exam: Right hip good range of motion ambulates with a rolling walker with a nonantalgic gait.  Surgical incisions healing well no signs of infection well approximated with staples.  Calf supple nontender.  Impression: Status post left total hip arthroplasty 06/11/2019  Plan: Staples removed Steri-Strips applied.  She will work on scar tissue mobilization.  She will pick up some Mederma which she will begin applying once her Steri-Strips have fallen off.  She will get the incision wet.  She will go on a 1 mg aspirin once daily for another week and then discontinue as she is on no aspirin prior to surgery.  Questions were encouraged and answered by Dr. Ninfa Linden and myself.  Follow-up in 1 month sooner if there is any questions or concerns.

## 2019-06-26 ENCOUNTER — Other Ambulatory Visit: Payer: Self-pay | Admitting: Orthopaedic Surgery

## 2019-06-26 MED ORDER — HYDROCODONE-ACETAMINOPHEN 5-325 MG PO TABS: 1.0000 | ORAL_TABLET | Freq: Four times a day (QID) | ORAL | 0 refills | Status: AC | PRN

## 2019-06-26 NOTE — Telephone Encounter (Signed)
Please advise 

## 2019-07-03 ENCOUNTER — Other Ambulatory Visit: Payer: Self-pay | Admitting: Orthopaedic Surgery

## 2019-07-03 NOTE — Telephone Encounter (Signed)
Refill? Joint replacement surgery on 06/11/19

## 2019-07-03 NOTE — Telephone Encounter (Signed)
CB pt 

## 2019-07-04 DIAGNOSIS — Z780 Asymptomatic menopausal state: Secondary | ICD-10-CM | POA: Diagnosis not present

## 2019-07-04 DIAGNOSIS — S80819A Abrasion, unspecified lower leg, initial encounter: Secondary | ICD-10-CM | POA: Diagnosis not present

## 2019-07-04 DIAGNOSIS — M8580 Other specified disorders of bone density and structure, unspecified site: Secondary | ICD-10-CM | POA: Diagnosis not present

## 2019-07-23 ENCOUNTER — Other Ambulatory Visit: Payer: Self-pay

## 2019-07-23 ENCOUNTER — Encounter: Payer: Self-pay | Admitting: Orthopaedic Surgery

## 2019-07-23 ENCOUNTER — Ambulatory Visit (INDEPENDENT_AMBULATORY_CARE_PROVIDER_SITE_OTHER): Payer: PPO | Admitting: Orthopaedic Surgery

## 2019-07-23 ENCOUNTER — Ambulatory Visit (INDEPENDENT_AMBULATORY_CARE_PROVIDER_SITE_OTHER): Payer: PPO

## 2019-07-23 DIAGNOSIS — M545 Low back pain, unspecified: Secondary | ICD-10-CM

## 2019-07-23 DIAGNOSIS — Z96642 Presence of left artificial hip joint: Secondary | ICD-10-CM

## 2019-07-23 NOTE — Progress Notes (Signed)
Office Visit Note   Patient: Catherine Trujillo           Date of Birth: [birth year]           MRN: [id number] Visit Date: 07/23/2019              Requested by: Emmaline Kluver, MD [address] [place],  [place] [address] PCP: Venetia Maxon, Sharon Mt, MD   Assessment & Plan: Visit Diagnoses:  1. Acute midline low back pain without sciatica   2. Status post total replacement of left hip     Plan:  We will have recommended physical therapy for strengthening core, modalities, home exercise program and stretching.  She will follow-up with [place] in 1 month for both her hip and her low back.  Questions were encouraged and answered by Dr. Ninfa Linden and myself.  Follow-Up Instructions: No follow-ups on file.   Orders:  Orders Placed This Encounter  Procedures  . XR Lumbar Spine 2-3 Views   No orders of the defined types were placed in this encounter.     Procedures: No procedures performed   Clinical Data: No additional findings.   Subjective: Chief Complaint  Patient presents with  . Left Hip - Follow-up    HPI Catherine Trujillo comes in today 42 days status post left total hip arthroplasty.  She states her hip is doing well she has had some slight discomfort in the left hip groin region.  No known injury.  She feels her leg lengths are equal.  She is also having some low back pain which she rates a 6-7 out of 10 pain most of the time she states pain is constantly there.  It does awaken her.  She is having no radicular symptoms down either leg.  Denies any saddle anesthesia or bowel bladder dysfunction.  No particular injury to the back.  Review of Systems Negative for fevers chills shortness breath chest pain  Objective: Vital Signs: There were no vitals taken for this visit.  Physical Exam Constitutional:      Appearance: She is not ill-appearing or diaphoretic.  Pulmonary:     Effort: Pulmonary effort is normal.  Neurological:     Mental Status: She is alert and  oriented to person, place, and time.  Psychiatric:        Mood and Affect: Mood normal.        Behavior: Behavior normal.     Ortho Exam Bilateral lower extremity 5 out of 5 strength throughout against resistance.  Negative straight leg raise bilaterally.  Deep tendon reflexes are 2+ at the knees and ankles and equal and symmetric.  Full forward flexion lumbar spine without pain.  Slightly limited extension lumbar spine.  Tenderness over the lower lumbar spinal column and paraspinous region bilaterally.  Bilateral hips good range of motion slight discomfort with internal rotation of the left hip. Specialty Comments:  No specialty comments available.  Imaging: Xr Lumbar Spine 2-3 Views  Result Date: 07/23/2019 Lumbar spine AP and lateral views: Slight scoliosis lumbar spine.  Grade 1 anterior spondylolisthesis L4 on 5.  No acute fractures.  No bony abnormalities.     PMFS History: Patient Active Problem List   Diagnosis Date Noted  . Status post total replacement of left hip 06/11/2019  . Unilateral primary osteoarthritis, left hip 05/14/2019   Past Medical History:  Diagnosis Date  . Asthma    As a child, Outgrew it as an adult    No family history  on file.  Past Surgical History:  Procedure Laterality Date  . HIP ARTHROPLASTY Right   . TOTAL HIP ARTHROPLASTY Left 06/11/2019  . TOTAL HIP ARTHROPLASTY Left 06/11/2019   Procedure: LEFT TOTAL HIP ARTHROPLASTY ANTERIOR APPROACH;  Surgeon: Mcarthur Rossetti, MD;  Location: [facility];  Service: Orthopedics;  Laterality: Left;   Social History   Occupational History  . Not on file  Tobacco Use  . Smoking status: Never Smoker  . Smokeless tobacco: Never Used  Substance and Sexual Activity  . Alcohol use: Yes    Comment: Occasional  . Drug use: Never  . Sexual activity: Not on file

## 2019-08-20 ENCOUNTER — Ambulatory Visit (INDEPENDENT_AMBULATORY_CARE_PROVIDER_SITE_OTHER): Payer: PPO | Admitting: Orthopaedic Surgery

## 2019-08-20 ENCOUNTER — Encounter: Payer: Self-pay | Admitting: Orthopaedic Surgery

## 2019-08-20 ENCOUNTER — Other Ambulatory Visit: Payer: Self-pay

## 2019-08-20 DIAGNOSIS — M545 Low back pain, unspecified: Secondary | ICD-10-CM

## 2019-08-20 DIAGNOSIS — Z96642 Presence of left artificial hip joint: Secondary | ICD-10-CM

## 2019-08-20 NOTE — Progress Notes (Signed)
The patient is 9 weeks status post a left total hip arthroplasty.  At her last visit she was complaining of low back pain and sciatic pain but she said that is since resolved.  She says she is doing great.  She still has a little bit of pain in the groin and around the hip when she first gets up and take some steps but she walks it off completely.  She says her back pain is resolved.  She is ambulating without a limp.  Her leg lengths are equal.  She tolerates me easily put in her left operative hip through range of motion.  At this point we do not need to see her back for 6 months unless she is having issues.  At that visit I like a standing low AP pelvis and lateral of the right operative hip.  All question concerns were answered and addressed.

## 2019-09-10 DIAGNOSIS — K589 Irritable bowel syndrome without diarrhea: Secondary | ICD-10-CM | POA: Diagnosis not present

## 2019-09-10 DIAGNOSIS — Z800 Family history of malignant neoplasm of digestive organs: Secondary | ICD-10-CM | POA: Diagnosis not present

## 2019-09-23 ENCOUNTER — Other Ambulatory Visit: Payer: Self-pay

## 2020-01-30 DIAGNOSIS — Z1231 Encounter for screening mammogram for malignant neoplasm of breast: Secondary | ICD-10-CM | POA: Diagnosis not present

## 2020-02-18 ENCOUNTER — Encounter: Payer: Self-pay | Admitting: Orthopaedic Surgery

## 2020-02-18 ENCOUNTER — Ambulatory Visit: Payer: PPO | Admitting: Orthopaedic Surgery

## 2020-02-18 ENCOUNTER — Other Ambulatory Visit: Payer: Self-pay

## 2020-02-18 ENCOUNTER — Ambulatory Visit (INDEPENDENT_AMBULATORY_CARE_PROVIDER_SITE_OTHER): Payer: PPO

## 2020-02-18 DIAGNOSIS — M545 Low back pain: Secondary | ICD-10-CM

## 2020-02-18 DIAGNOSIS — Z96642 Presence of left artificial hip joint: Secondary | ICD-10-CM

## 2020-02-18 DIAGNOSIS — G8929 Other chronic pain: Secondary | ICD-10-CM

## 2020-02-18 NOTE — Progress Notes (Signed)
Office Visit Note   Patient: Catherine Trujillo           Date of Birth: 10/03/1941           MRN: [id number] Visit Date: 02/18/2020              Requested by: Emmaline Kluver, MD [address] [place],  [place] [address] PCP: Venetia Maxon, Sharon Mt, MD   Assessment & Plan: Visit Diagnoses:  1. History of left hip replacement   2. Chronic bilateral low back pain without sciatica     Plan: From the standpoint of her hips, she can follow-up as needed.  If she does develop any type of worsening hip pain or dull and aching hip pain she knows to come back and see [place].  From a spine standpoint, I have recommended outpatient physical therapy down in the part of stay where she lives.  I gave her a generic prescription for therapy of her spine.  All questions and concerns were answered and addressed.  Follow-up should be as needed.  If she does develop worsening issues she will let us know.  Follow-Up Instructions: Return if symptoms worsen or fail to improve.   Orders:  Orders Placed This Encounter  Procedures  . XR HIP UNILAT W OR W/O PELVIS 1V LEFT   No orders of the defined types were placed in this encounter.     Procedures: No procedures performed   Clinical Data: No additional findings.   Subjective: Chief Complaint  Patient presents with  . Left Hip - Follow-up  The patient is now 8 months status post a left total hip arthroplasty through a very tender approach.  She has a remote history of a right posterior hip replacement.  She states that her hips are doing great.  She is having low back pain.  She denies any radicular symptoms or weakness.  She is not walking with any assistive device.  She is very active 79 year old female.  She denies any acute change in medical status.  She denies any change in bowel bladder function.  HPI  Review of Systems She currently denies any headache, chest pain, shortness of breath, fever, chills, nausea, vomiting  Objective:  Vital Signs: There were no vitals taken for this visit.  Physical Exam She is alert and orient x3 and in no acute distress Ortho Exam Examination of her hips on both sides shows full and fluid range of motion with no pain at all.  She does have pain to palpation of the lower aspect of her lumbar spine in the midline in the paraspinal muscles. Specialty Comments:  No specialty comments available.  Imaging: XR HIP UNILAT W OR W/O PELVIS 1V LEFT  Result Date: 02/18/2020 X-rays of the pelvis and hips show bilateral total hip arthroplasties with no complicating features.  There is no evidence of loosening or osteolysis.  I did see x-rays from last year of her lumbar spine and it does show degenerative changes at multiple levels.  PMFS History: Patient Active Problem List   Diagnosis Date Noted  . Status post total replacement of left hip 06/11/2019  . Unilateral primary osteoarthritis, left hip 05/14/2019   Past Medical History:  Diagnosis Date  . Asthma    As a child, Outgrew it as an adult    No family history on file.  Past Surgical History:  Procedure Laterality Date  . HIP ARTHROPLASTY Right   . TOTAL HIP ARTHROPLASTY Left 06/11/2019  . TOTAL  HIP ARTHROPLASTY Left 06/11/2019   Procedure: LEFT TOTAL HIP ARTHROPLASTY ANTERIOR APPROACH;  Surgeon: Mcarthur Rossetti, MD;  Location: [facility];  Service: Orthopedics;  Laterality: Left;   Social History   Occupational History  . Not on file  Tobacco Use  . Smoking status: Never Smoker  . Smokeless tobacco: Never Used  Substance and Sexual Activity  . Alcohol use: Yes    Comment: Occasional  . Drug use: Never  . Sexual activity: Not on file

## 2020-03-12 DIAGNOSIS — H25812 Combined forms of age-related cataract, left eye: Secondary | ICD-10-CM | POA: Diagnosis not present

## 2020-03-12 DIAGNOSIS — Z961 Presence of intraocular lens: Secondary | ICD-10-CM | POA: Diagnosis not present

## 2020-03-28 IMAGING — RF OPERATIVE LEFT HIP WITH PELVIS
1 series · 2 of 2 positions shown · non-contrast
Comparison: None.

CLINICAL DATA: Left hip replacement

EXAM:
OPERATIVE LEFT HIP WITH PELVIS; DG C-ARM 61-120 MIN

[Series 1: unknown protocol · 0.20mm/px · 2 of 2 slices shown]
[im 1/2]
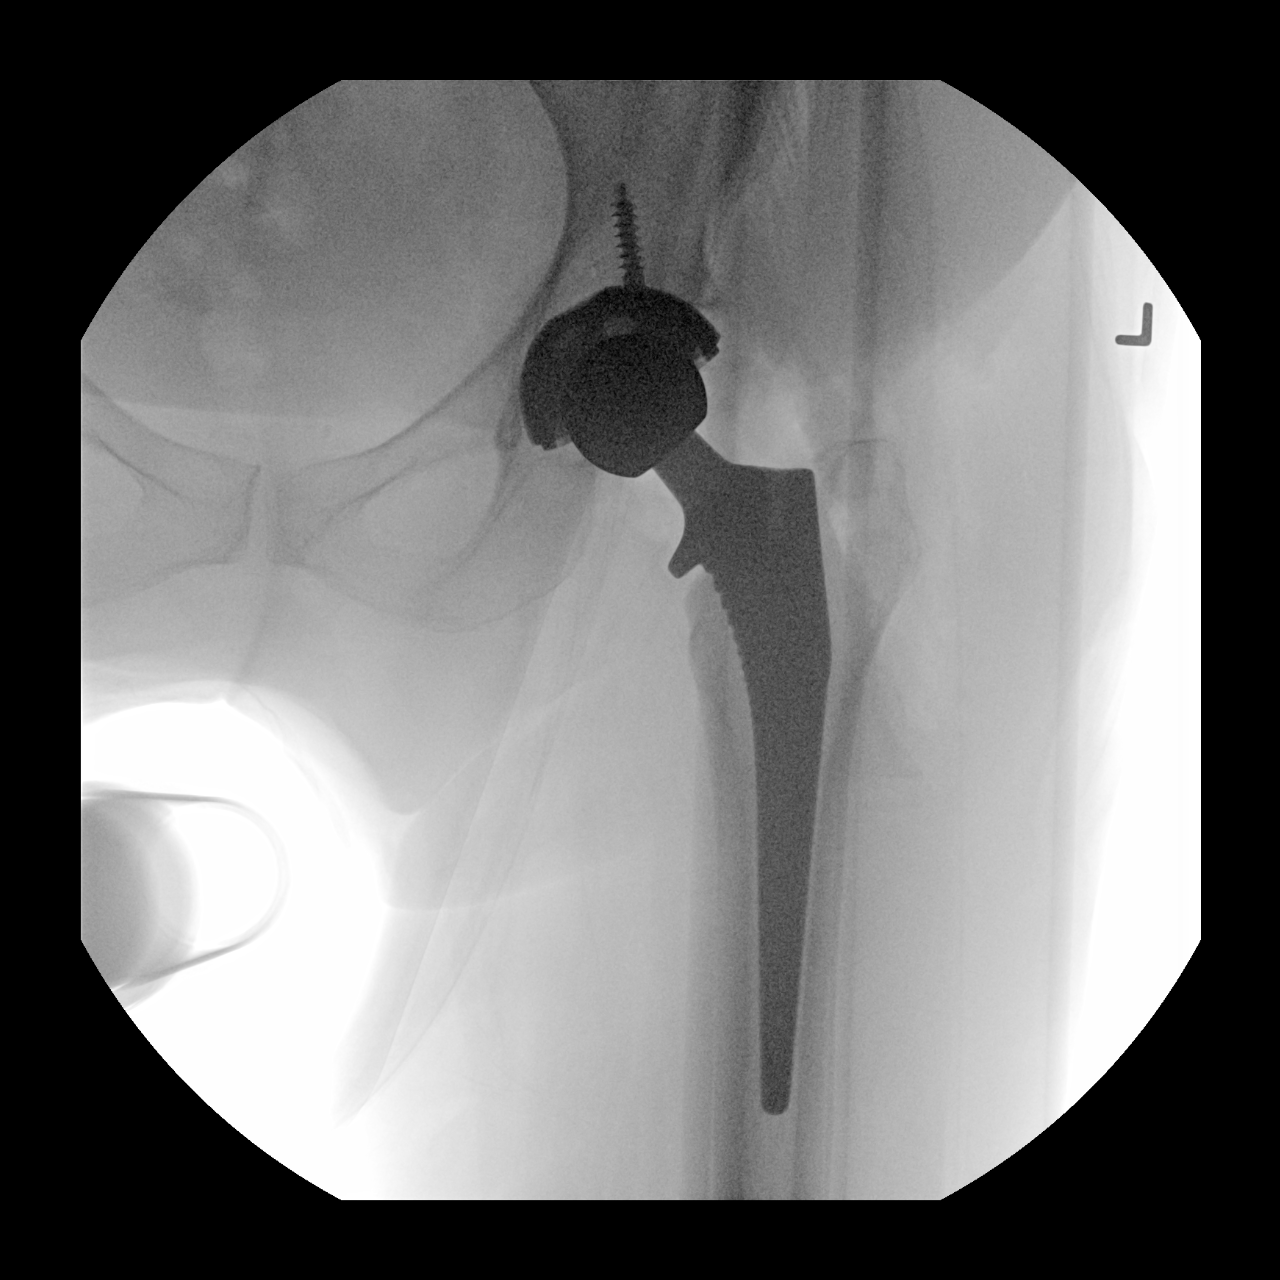
[im 2/2]
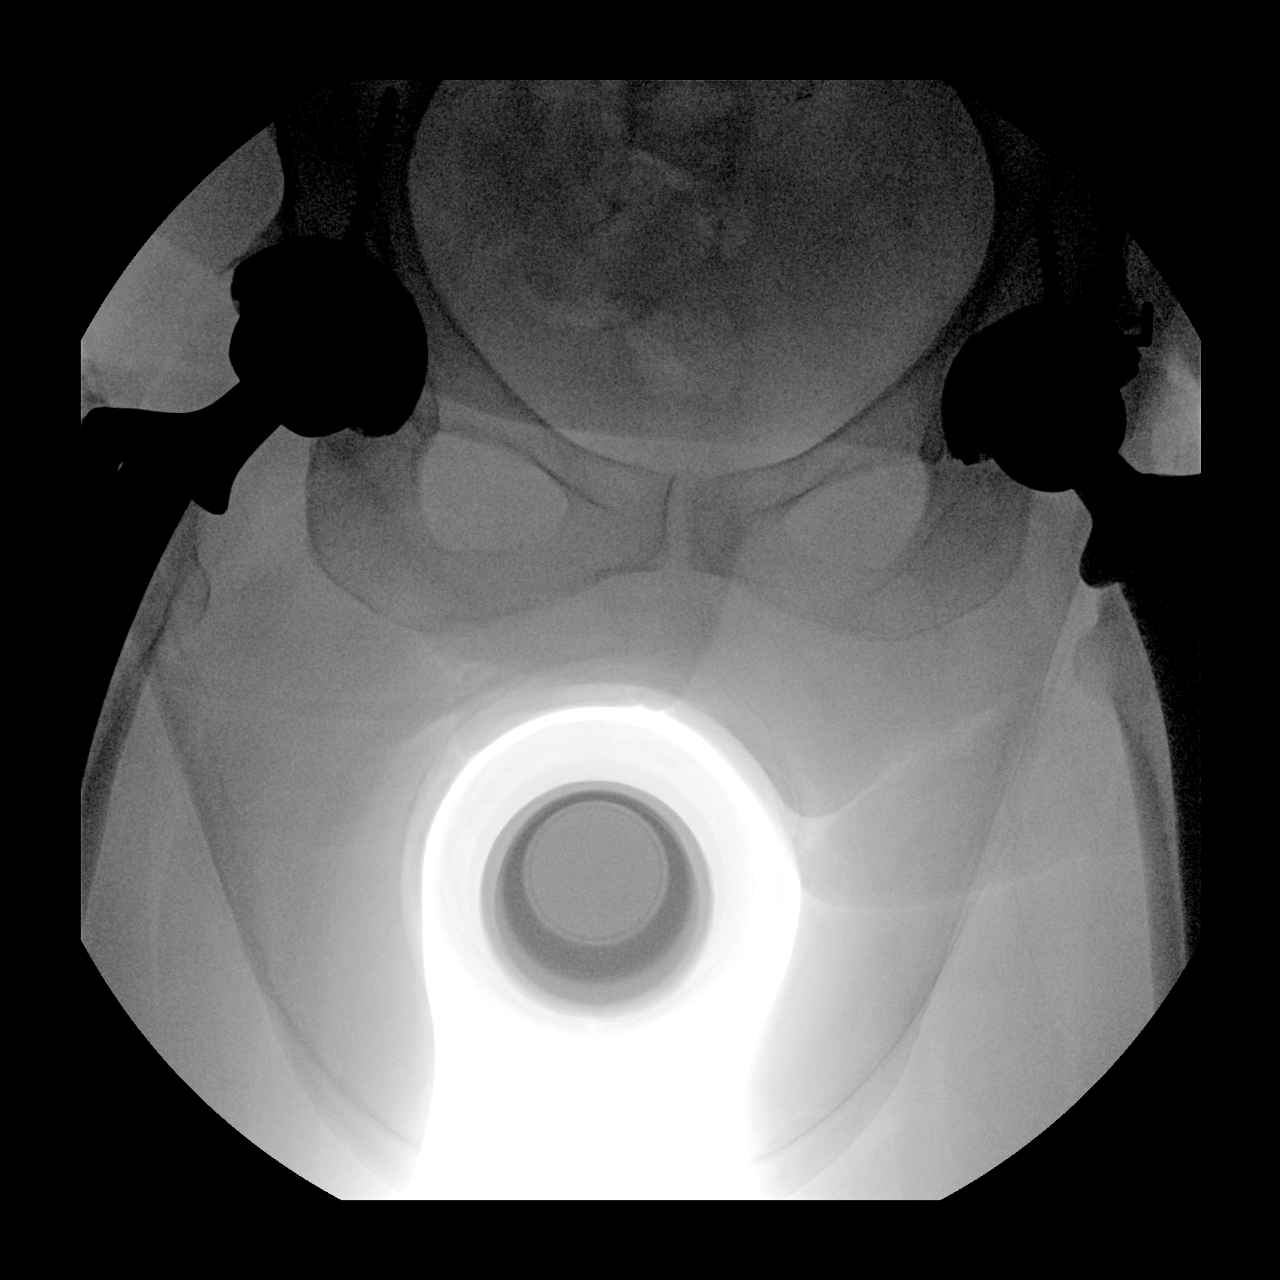

[2 of 2 positions shown; findings below may reference images not displayed]

FLUOROSCOPY TIME:  Fluoroscopy Time:  24 seconds

Radiation Exposure Index (if provided by the fluoroscopic device):
0.84 mGy

Number of Acquired Spot Images: 2
FINDINGS: Left hip replacement is noted in satisfactory position. No acute
soft tissue or bony abnormality is noted.
IMPRESSION: Status post left hip replacement.

## 2020-04-08 DIAGNOSIS — Z01818 Encounter for other preprocedural examination: Secondary | ICD-10-CM | POA: Diagnosis not present

## 2020-04-08 DIAGNOSIS — H25812 Combined forms of age-related cataract, left eye: Secondary | ICD-10-CM | POA: Diagnosis not present

## 2020-04-14 DIAGNOSIS — F418 Other specified anxiety disorders: Secondary | ICD-10-CM | POA: Diagnosis not present

## 2020-04-14 DIAGNOSIS — G8929 Other chronic pain: Secondary | ICD-10-CM | POA: Diagnosis not present

## 2020-04-14 DIAGNOSIS — Z833 Family history of diabetes mellitus: Secondary | ICD-10-CM | POA: Diagnosis not present

## 2020-04-14 DIAGNOSIS — M545 Low back pain: Secondary | ICD-10-CM | POA: Diagnosis not present

## 2020-04-14 DIAGNOSIS — M1990 Unspecified osteoarthritis, unspecified site: Secondary | ICD-10-CM | POA: Diagnosis not present

## 2020-04-14 DIAGNOSIS — K529 Noninfective gastroenteritis and colitis, unspecified: Secondary | ICD-10-CM | POA: Diagnosis not present

## 2020-04-14 DIAGNOSIS — Z8249 Family history of ischemic heart disease and other diseases of the circulatory system: Secondary | ICD-10-CM | POA: Diagnosis not present

## 2020-04-14 DIAGNOSIS — Z800 Family history of malignant neoplasm of digestive organs: Secondary | ICD-10-CM | POA: Diagnosis not present

## 2020-04-14 DIAGNOSIS — R6889 Other general symptoms and signs: Secondary | ICD-10-CM | POA: Diagnosis not present

## 2020-04-14 DIAGNOSIS — K589 Irritable bowel syndrome without diarrhea: Secondary | ICD-10-CM | POA: Diagnosis not present

## 2020-04-14 DIAGNOSIS — Z820 Family history of epilepsy and other diseases of the nervous system: Secondary | ICD-10-CM | POA: Diagnosis not present

## 2020-04-14 DIAGNOSIS — Z8269 Family history of other diseases of the musculoskeletal system and connective tissue: Secondary | ICD-10-CM | POA: Diagnosis not present

## 2020-04-20 DIAGNOSIS — H2512 Age-related nuclear cataract, left eye: Secondary | ICD-10-CM | POA: Diagnosis not present

## 2020-04-20 DIAGNOSIS — H25812 Combined forms of age-related cataract, left eye: Secondary | ICD-10-CM | POA: Diagnosis not present

## 2020-06-11 DIAGNOSIS — D126 Benign neoplasm of colon, unspecified: Secondary | ICD-10-CM | POA: Diagnosis not present

## 2020-06-11 DIAGNOSIS — R197 Diarrhea, unspecified: Secondary | ICD-10-CM | POA: Diagnosis not present

## 2020-06-13 DIAGNOSIS — Z20822 Contact with and (suspected) exposure to covid-19: Secondary | ICD-10-CM | POA: Diagnosis not present

## 2020-06-13 DIAGNOSIS — M542 Cervicalgia: Secondary | ICD-10-CM | POA: Diagnosis not present

## 2020-07-07 DIAGNOSIS — R197 Diarrhea, unspecified: Secondary | ICD-10-CM | POA: Diagnosis not present

## 2020-07-07 DIAGNOSIS — K5730 Diverticulosis of large intestine without perforation or abscess without bleeding: Secondary | ICD-10-CM | POA: Diagnosis not present

## 2020-07-07 DIAGNOSIS — D126 Benign neoplasm of colon, unspecified: Secondary | ICD-10-CM | POA: Diagnosis not present

## 2020-07-07 DIAGNOSIS — Z980 Intestinal bypass and anastomosis status: Secondary | ICD-10-CM | POA: Diagnosis not present

## 2020-07-07 DIAGNOSIS — K635 Polyp of colon: Secondary | ICD-10-CM | POA: Diagnosis not present

## 2020-09-09 DIAGNOSIS — J309 Allergic rhinitis, unspecified: Secondary | ICD-10-CM | POA: Diagnosis not present

## 2020-09-09 DIAGNOSIS — J209 Acute bronchitis, unspecified: Secondary | ICD-10-CM | POA: Diagnosis not present

## 2020-09-09 DIAGNOSIS — R051 Acute cough: Secondary | ICD-10-CM | POA: Diagnosis not present

## 2022-05-30 ENCOUNTER — Telehealth: Payer: Self-pay | Admitting: *Deleted

## 2022-06-27 NOTE — Telephone Encounter (Signed)
error 

## 2022-09-20 ENCOUNTER — Ambulatory Visit: Payer: Medicare Other | Admitting: Podiatry

## 2022-09-20 DIAGNOSIS — M79675 Pain in left toe(s): Secondary | ICD-10-CM

## 2022-09-20 DIAGNOSIS — B351 Tinea unguium: Secondary | ICD-10-CM

## 2022-09-20 DIAGNOSIS — M79674 Pain in right toe(s): Secondary | ICD-10-CM

## 2022-09-20 NOTE — Progress Notes (Signed)
  Subjective:  Patient ID: Catherine Trujillo, female    DOB: [birth year],  MRN: [id number]  Chief Complaint  Patient presents with   Nail Problem    Routine Foot Care     81 y.o. female presents with the above complaint. History confirmed with patient. Patient presenting with pain related to dystrophic thickened elongated nails. Patient is unable to trim own nails related to nail dystrophy and/or mobility issues. Patient does not have a history of T2DM.  Objective:  Physical Exam: warm, good capillary refill nail exam onychomycosis of the toenails, onycholysis, and dystrophic nails DP pulses palpable, PT pulses palpable, and protective sensation intact Left Foot:  Pain with palpation of nails due to elongation and dystrophic growth.  Right Foot: Pain with palpation of nails due to elongation and dystrophic growth.   Assessment:   1. Pain due to onychomycosis of toenails of both feet      Plan:  Patient was evaluated and treated and all questions answered.   #Onychomycosis with pain  -Nails palliatively debrided as below. -Educated on self-care  Procedure: Nail Debridement Rationale: Pain Type of Debridement: manual, sharp debridement. Instrumentation: Nail nipper, rotary burr. Number of Nails: 10  Return in about 3 months (around 12/21/2022) for RFC.         Everitt Amber, DPM [facility]

## 2022-12-27 ENCOUNTER — Ambulatory Visit: Payer: Medicare Other | Admitting: Podiatry

## 2022-12-27 DIAGNOSIS — B351 Tinea unguium: Secondary | ICD-10-CM

## 2022-12-27 DIAGNOSIS — M79675 Pain in left toe(s): Secondary | ICD-10-CM | POA: Diagnosis not present

## 2022-12-27 DIAGNOSIS — M79674 Pain in right toe(s): Secondary | ICD-10-CM

## 2022-12-27 NOTE — Progress Notes (Signed)
  Subjective:  Patient ID: Catherine Trujillo, female    DOB: 26-Jan-1941,  MRN: [id number]  Chief Complaint  Patient presents with   Nail Problem    Routine Foot Care     82 y.o. female presents with the above complaint. History confirmed with patient. Patient presenting with pain related to dystrophic thickened elongated nails. Patient is unable to trim own nails related to nail dystrophy and/or mobility issues. Patient does not have a history of T2DM.  Objective:  Physical Exam: warm, good capillary refill nail exam onychomycosis of the toenails, onycholysis, and dystrophic nails DP pulses palpable, PT pulses palpable, and protective sensation intact Left Foot:  Pain with palpation of nails due to elongation and dystrophic growth.  Right Foot: Pain with palpation of nails due to elongation and dystrophic growth.   Assessment:   1. Pain due to onychomycosis of toenails of both feet       Plan:  Patient was evaluated and treated and all questions answered.   #Onychomycosis with pain  -Nails palliatively debrided as below. -Educated on self-care  Procedure: Nail Debridement Rationale: Pain Type of Debridement: manual, sharp debridement. Instrumentation: Nail nipper, rotary burr. Number of Nails: 10  Return in about 3 months (around 03/27/2023) for RFC.         Everitt Amber, DPM [facility]

## 2023-02-02 DIAGNOSIS — F28 Other psychotic disorder not due to a substance or known physiological condition: Secondary | ICD-10-CM

## 2023-02-02 DIAGNOSIS — F0390 Unspecified dementia without behavioral disturbance: Secondary | ICD-10-CM

## 2023-02-02 DIAGNOSIS — M545 Low back pain, unspecified: Secondary | ICD-10-CM | POA: Diagnosis not present

## 2023-02-02 DIAGNOSIS — F05 Delirium due to known physiological condition: Secondary | ICD-10-CM

## 2023-02-02 DIAGNOSIS — J45909 Unspecified asthma, uncomplicated: Secondary | ICD-10-CM | POA: Diagnosis not present

## 2023-02-02 DIAGNOSIS — M1990 Unspecified osteoarthritis, unspecified site: Secondary | ICD-10-CM | POA: Diagnosis not present

## 2023-02-02 DIAGNOSIS — F22 Delusional disorders: Secondary | ICD-10-CM

## 2023-02-02 DIAGNOSIS — F419 Anxiety disorder, unspecified: Secondary | ICD-10-CM

## 2023-02-02 DIAGNOSIS — R053 Chronic cough: Secondary | ICD-10-CM | POA: Diagnosis not present

## 2023-02-09 DIAGNOSIS — F0390 Unspecified dementia without behavioral disturbance: Secondary | ICD-10-CM | POA: Diagnosis not present

## 2023-02-09 DIAGNOSIS — R7303 Prediabetes: Secondary | ICD-10-CM | POA: Diagnosis not present

## 2023-02-09 DIAGNOSIS — R799 Abnormal finding of blood chemistry, unspecified: Secondary | ICD-10-CM | POA: Diagnosis not present

## 2023-02-09 DIAGNOSIS — E559 Vitamin D deficiency, unspecified: Secondary | ICD-10-CM | POA: Diagnosis not present

## 2023-02-23 DIAGNOSIS — F0390 Unspecified dementia without behavioral disturbance: Secondary | ICD-10-CM | POA: Diagnosis not present

## 2023-02-23 DIAGNOSIS — K5900 Constipation, unspecified: Secondary | ICD-10-CM | POA: Diagnosis not present

## 2023-03-02 DIAGNOSIS — D649 Anemia, unspecified: Secondary | ICD-10-CM

## 2023-03-02 DIAGNOSIS — F0390 Unspecified dementia without behavioral disturbance: Secondary | ICD-10-CM | POA: Diagnosis not present

## 2023-03-02 DIAGNOSIS — M6281 Muscle weakness (generalized): Secondary | ICD-10-CM

## 2023-03-02 DIAGNOSIS — J45909 Unspecified asthma, uncomplicated: Secondary | ICD-10-CM | POA: Diagnosis not present

## 2023-03-02 DIAGNOSIS — R269 Unspecified abnormalities of gait and mobility: Secondary | ICD-10-CM

## 2023-03-02 DIAGNOSIS — J209 Acute bronchitis, unspecified: Secondary | ICD-10-CM | POA: Diagnosis not present

## 2023-03-02 DIAGNOSIS — J441 Chronic obstructive pulmonary disease with (acute) exacerbation: Secondary | ICD-10-CM | POA: Diagnosis not present

## 2023-03-09 DIAGNOSIS — Z7689 Persons encountering health services in other specified circumstances: Secondary | ICD-10-CM | POA: Diagnosis not present

## 2023-03-09 DIAGNOSIS — F0390 Unspecified dementia without behavioral disturbance: Secondary | ICD-10-CM | POA: Diagnosis not present

## 2023-03-09 DIAGNOSIS — K30 Functional dyspepsia: Secondary | ICD-10-CM | POA: Diagnosis not present

## 2023-03-21 ENCOUNTER — Ambulatory Visit: Payer: Medicare PPO | Admitting: Podiatry

## 2023-03-23 DIAGNOSIS — F0390 Unspecified dementia without behavioral disturbance: Secondary | ICD-10-CM | POA: Diagnosis not present

## 2023-03-23 DIAGNOSIS — L84 Corns and callosities: Secondary | ICD-10-CM | POA: Diagnosis not present

## 2023-03-23 DIAGNOSIS — R52 Pain, unspecified: Secondary | ICD-10-CM | POA: Diagnosis not present

## 2023-03-30 DIAGNOSIS — R059 Cough, unspecified: Secondary | ICD-10-CM

## 2023-03-30 DIAGNOSIS — J45909 Unspecified asthma, uncomplicated: Secondary | ICD-10-CM

## 2023-03-30 DIAGNOSIS — J209 Acute bronchitis, unspecified: Secondary | ICD-10-CM

## 2023-03-30 DIAGNOSIS — R350 Frequency of micturition: Secondary | ICD-10-CM

## 2023-03-30 DIAGNOSIS — B370 Candidal stomatitis: Secondary | ICD-10-CM

## 2023-03-30 DIAGNOSIS — F0390 Unspecified dementia without behavioral disturbance: Secondary | ICD-10-CM

## 2023-03-30 DIAGNOSIS — K5900 Constipation, unspecified: Secondary | ICD-10-CM

## 2023-04-06 DIAGNOSIS — W19XXXA Unspecified fall, initial encounter: Secondary | ICD-10-CM

## 2023-04-06 DIAGNOSIS — K429 Umbilical hernia without obstruction or gangrene: Secondary | ICD-10-CM | POA: Diagnosis not present

## 2023-04-06 DIAGNOSIS — R1084 Generalized abdominal pain: Secondary | ICD-10-CM | POA: Diagnosis not present

## 2023-04-06 DIAGNOSIS — F0390 Unspecified dementia without behavioral disturbance: Secondary | ICD-10-CM

## 2023-04-06 DIAGNOSIS — K5900 Constipation, unspecified: Secondary | ICD-10-CM | POA: Diagnosis not present

## 2023-04-06 DIAGNOSIS — M6281 Muscle weakness (generalized): Secondary | ICD-10-CM | POA: Diagnosis not present

## 2023-04-06 DIAGNOSIS — R2689 Other abnormalities of gait and mobility: Secondary | ICD-10-CM

## 2023-04-06 DIAGNOSIS — Z9181 History of falling: Secondary | ICD-10-CM

## 2023-04-06 DIAGNOSIS — K588 Other irritable bowel syndrome: Secondary | ICD-10-CM

## 2023-04-06 DIAGNOSIS — R269 Unspecified abnormalities of gait and mobility: Secondary | ICD-10-CM

## 2023-04-13 DIAGNOSIS — R269 Unspecified abnormalities of gait and mobility: Secondary | ICD-10-CM | POA: Diagnosis not present

## 2023-04-13 DIAGNOSIS — E559 Vitamin D deficiency, unspecified: Secondary | ICD-10-CM | POA: Diagnosis not present

## 2023-04-13 DIAGNOSIS — W19XXXA Unspecified fall, initial encounter: Secondary | ICD-10-CM

## 2023-04-13 DIAGNOSIS — R531 Weakness: Secondary | ICD-10-CM | POA: Diagnosis not present

## 2023-04-13 DIAGNOSIS — M1990 Unspecified osteoarthritis, unspecified site: Secondary | ICD-10-CM | POA: Diagnosis not present

## 2023-04-13 DIAGNOSIS — R2689 Other abnormalities of gait and mobility: Secondary | ICD-10-CM

## 2023-04-13 DIAGNOSIS — M6281 Muscle weakness (generalized): Secondary | ICD-10-CM | POA: Diagnosis not present

## 2023-04-13 DIAGNOSIS — F0390 Unspecified dementia without behavioral disturbance: Secondary | ICD-10-CM

## 2023-04-19 DIAGNOSIS — Z7951 Long term (current) use of inhaled steroids: Secondary | ICD-10-CM

## 2023-04-19 DIAGNOSIS — J45909 Unspecified asthma, uncomplicated: Secondary | ICD-10-CM

## 2023-04-19 DIAGNOSIS — F0392 Unspecified dementia, unspecified severity, with psychotic disturbance: Secondary | ICD-10-CM

## 2023-04-19 DIAGNOSIS — M1990 Unspecified osteoarthritis, unspecified site: Secondary | ICD-10-CM

## 2023-04-20 DIAGNOSIS — R451 Restlessness and agitation: Secondary | ICD-10-CM | POA: Diagnosis not present

## 2023-04-20 DIAGNOSIS — F419 Anxiety disorder, unspecified: Secondary | ICD-10-CM

## 2023-04-20 DIAGNOSIS — F03918 Unspecified dementia, unspecified severity, with other behavioral disturbance: Secondary | ICD-10-CM | POA: Diagnosis not present

## 2023-05-17 DIAGNOSIS — M1990 Unspecified osteoarthritis, unspecified site: Secondary | ICD-10-CM | POA: Diagnosis not present

## 2023-05-17 DIAGNOSIS — J4520 Mild intermittent asthma, uncomplicated: Secondary | ICD-10-CM | POA: Diagnosis not present

## 2023-05-17 DIAGNOSIS — F03B4 Unspecified dementia, moderate, with anxiety: Secondary | ICD-10-CM | POA: Diagnosis not present

## 2023-05-17 DIAGNOSIS — F411 Generalized anxiety disorder: Secondary | ICD-10-CM | POA: Diagnosis not present

## 2023-05-17 DIAGNOSIS — F22 Delusional disorders: Secondary | ICD-10-CM | POA: Diagnosis not present

## 2023-05-17 DIAGNOSIS — J479 Bronchiectasis, uncomplicated: Secondary | ICD-10-CM | POA: Diagnosis not present

## 2023-06-01 DEATH — deceased
